# Patient Record
Sex: Male | Born: 1935 | Race: White | Hispanic: No | Marital: Married | Smoking: Never smoker
Health system: Southern US, Community
[De-identification: ages and names within clinical notes are randomized; demographics above are authoritative.]

## PROBLEM LIST (undated history)

## (undated) DIAGNOSIS — I10 Essential (primary) hypertension: Secondary | ICD-10-CM

## (undated) DIAGNOSIS — I639 Cerebral infarction, unspecified: Secondary | ICD-10-CM

## (undated) DIAGNOSIS — K219 Gastro-esophageal reflux disease without esophagitis: Secondary | ICD-10-CM

## (undated) DIAGNOSIS — Z8711 Personal history of peptic ulcer disease: Secondary | ICD-10-CM

## (undated) DIAGNOSIS — I499 Cardiac arrhythmia, unspecified: Secondary | ICD-10-CM

## (undated) DIAGNOSIS — N429 Disorder of prostate, unspecified: Secondary | ICD-10-CM

## (undated) DIAGNOSIS — Z8719 Personal history of other diseases of the digestive system: Secondary | ICD-10-CM

## (undated) DIAGNOSIS — L719 Rosacea, unspecified: Secondary | ICD-10-CM

## (undated) DIAGNOSIS — F419 Anxiety disorder, unspecified: Secondary | ICD-10-CM

## (undated) DIAGNOSIS — I219 Acute myocardial infarction, unspecified: Secondary | ICD-10-CM

## (undated) DIAGNOSIS — G629 Polyneuropathy, unspecified: Secondary | ICD-10-CM

## (undated) DIAGNOSIS — K759 Inflammatory liver disease, unspecified: Secondary | ICD-10-CM

## (undated) DIAGNOSIS — N189 Chronic kidney disease, unspecified: Secondary | ICD-10-CM

## (undated) HISTORY — PX: EYE SURGERY: SHX253

## (undated) HISTORY — PX: CORONARY ARTERY BYPASS GRAFT: SHX141

---

## 2000-12-13 DIAGNOSIS — Z951 Presence of aortocoronary bypass graft: Secondary | ICD-10-CM | POA: Insufficient documentation

## 2004-06-30 ENCOUNTER — Ambulatory Visit: Payer: Self-pay | Admitting: Urology

## 2004-11-26 ENCOUNTER — Ambulatory Visit: Payer: Self-pay | Admitting: Urology

## 2004-12-24 ENCOUNTER — Ambulatory Visit: Payer: Self-pay | Admitting: Urology

## 2005-01-06 ENCOUNTER — Ambulatory Visit: Payer: Self-pay | Admitting: Internal Medicine

## 2005-10-10 ENCOUNTER — Inpatient Hospital Stay: Payer: Self-pay | Admitting: Internal Medicine

## 2005-11-08 ENCOUNTER — Ambulatory Visit: Payer: Self-pay | Admitting: Gastroenterology

## 2005-12-17 IMAGING — RF DG UGI W/O KUB
1 series · 15 of 24 positions shown · non-contrast
Comparison: none

REASON FOR EXAM: duodenum diverticulum
COMMENTS:

[Series 1: run · 15 of 26 slices shown]
[im 1/26]
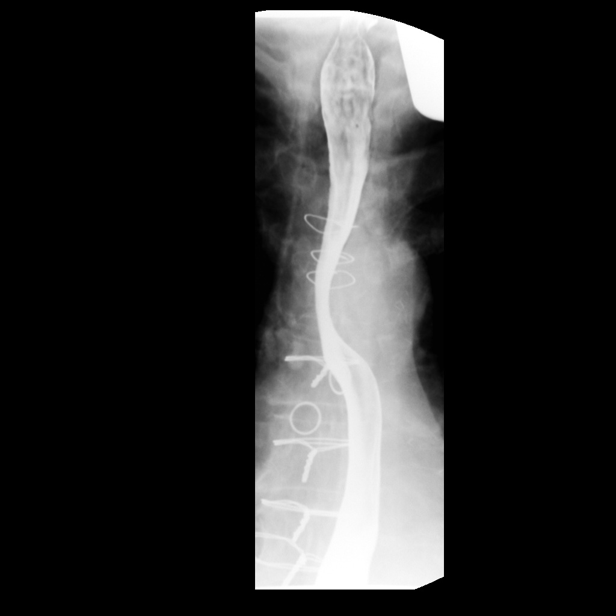
[im 3/26]
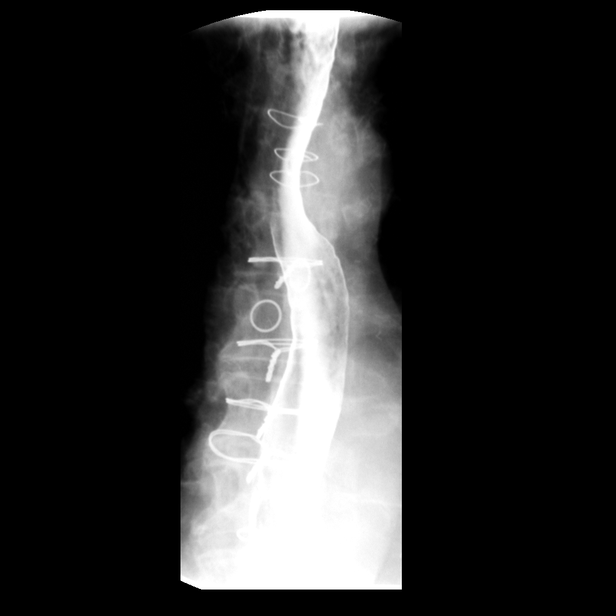
[im 5/26]
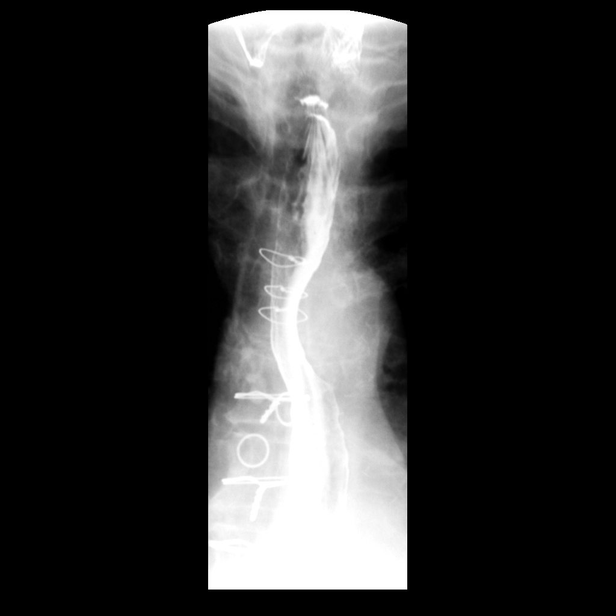
[im 6/26]
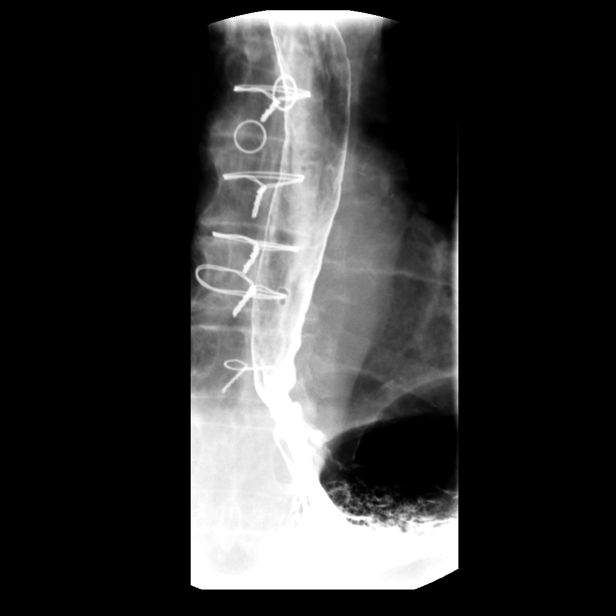
[im 8/26]
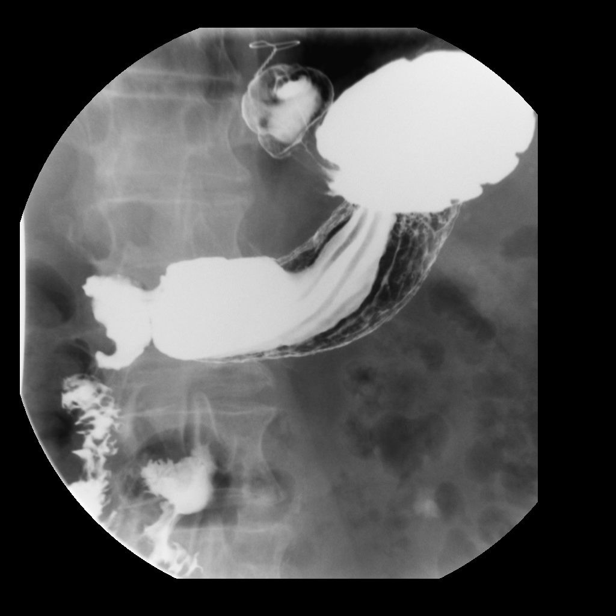
[im 9/26]
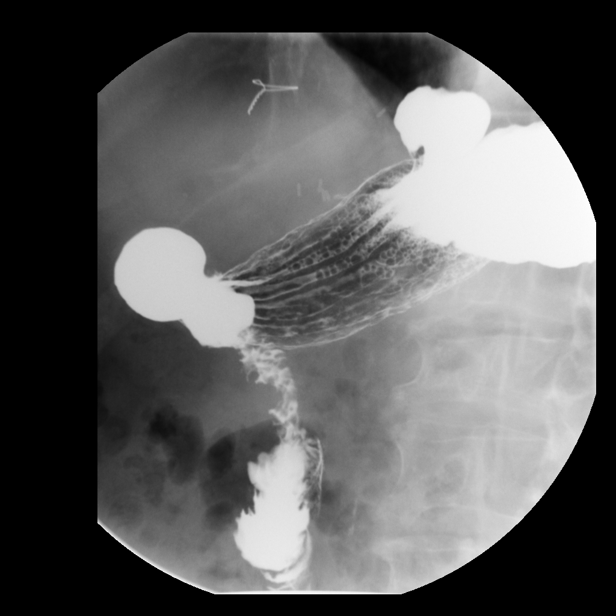
[im 11/26]
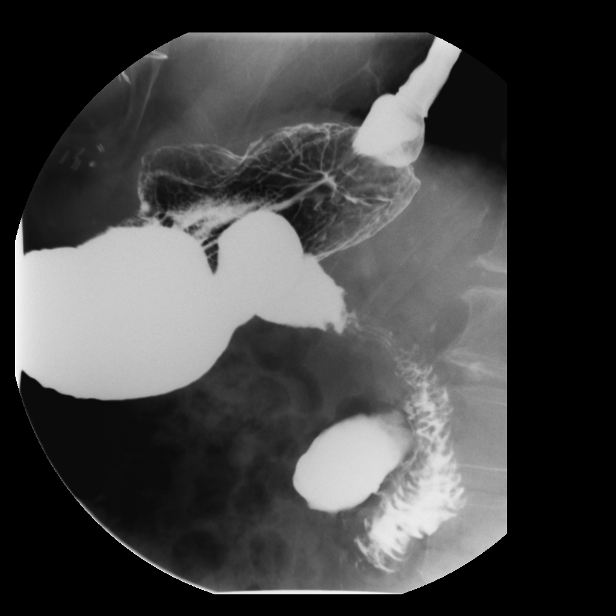
[im 14/26]
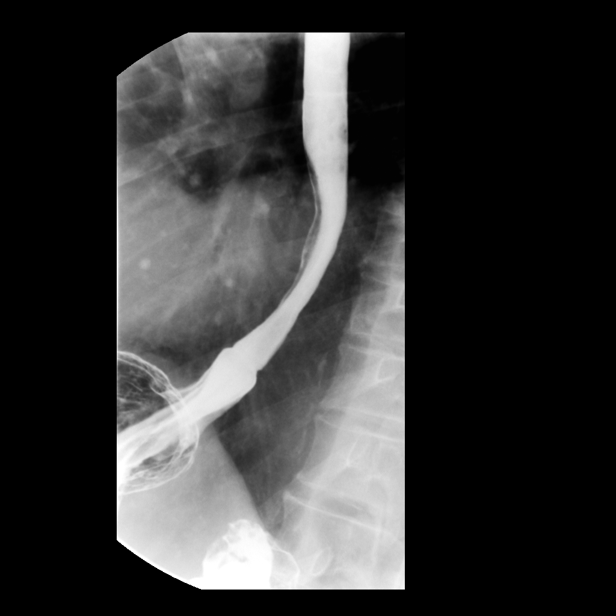
[im 15/26]
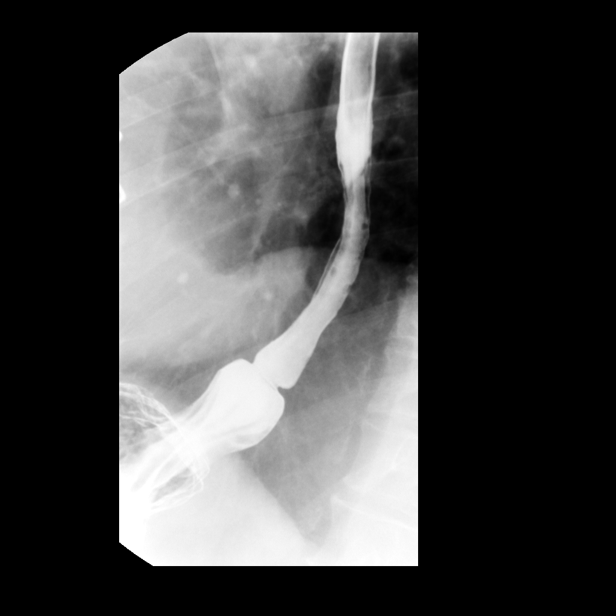
[im 17/26]
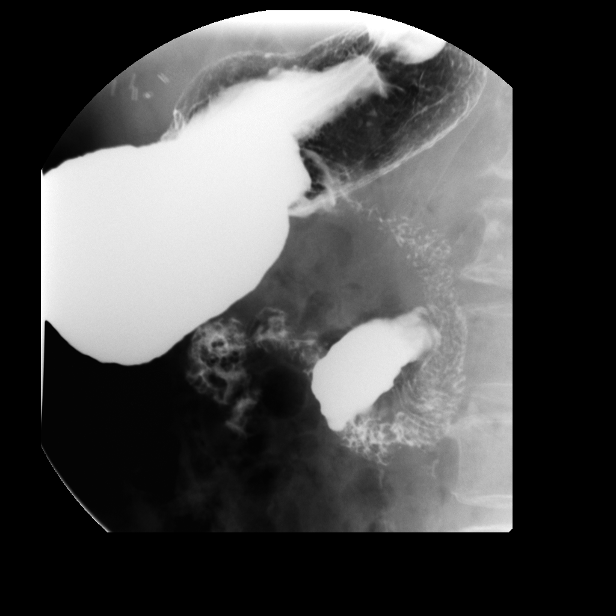
[im 18/26]
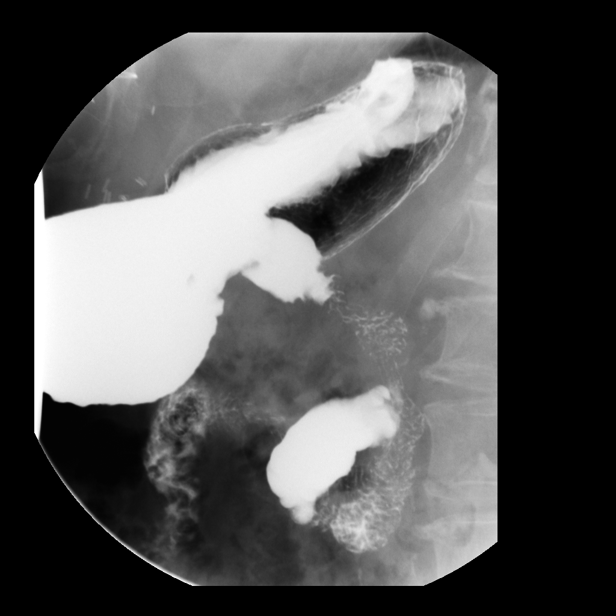
[im 20/26]
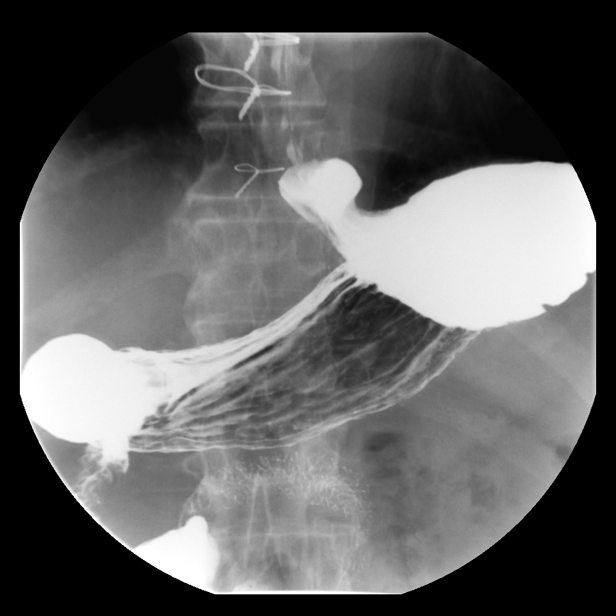
[im 22/26]
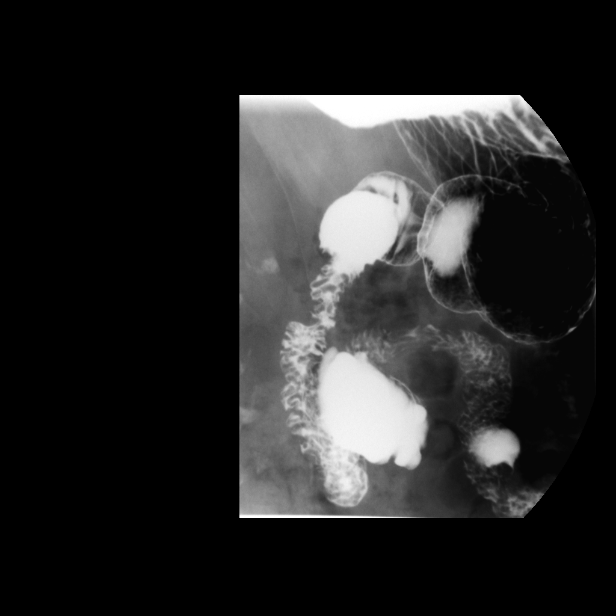
[im 23/26]
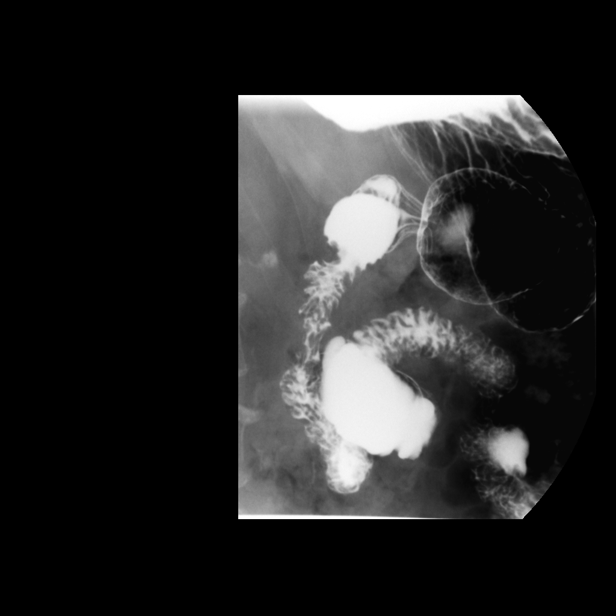
[im 26/26]
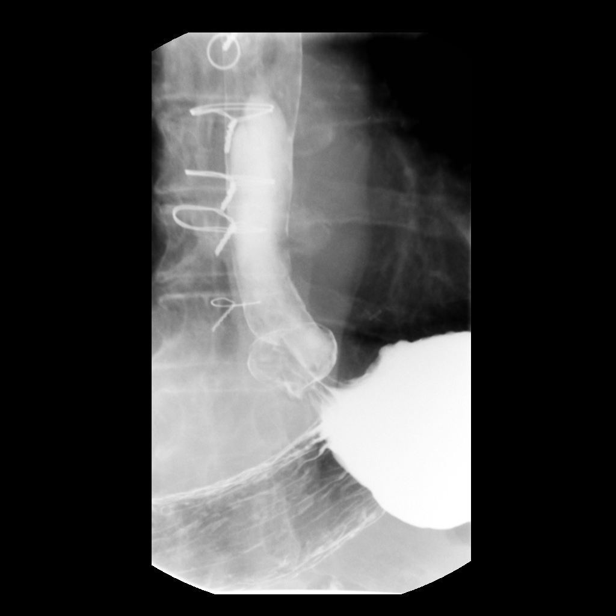

[15 of 24 positions shown; findings below may reference images not displayed]

PROCEDURE:     FL  - FL UPPER GI  - December 24, 2004  [DATE]

RESULT:     Barium passed through the esophagus unobstructed. A direct
sliding type hiatal hernia is present.  A moderate amount of
gastroesophageal reflux is noted on the water siphon exam.

A 12.5 mm barium impregnated tablet passed through the esophagus into the
stomach unobstructed.

The stomach appears normal throughout its length and empties without delay.
No ulcer craters are seen.  The duodenal bulb is within normal limits.

In the distal descending and proximal transverse duodenum there is a large
diverticulum present.  There is a second smaller diverticulum noted beyond
the ligament of Treitz.
IMPRESSION: 1)Direct sliding type hiatal hernia with a mild amount of gastroesophageal
reflux.

2)No ulcer crater is seen.

3)Large diverticulum in the descending duodenum and smaller one just beyond
the ligament of Treitz.

## 2006-03-27 ENCOUNTER — Ambulatory Visit: Payer: Self-pay | Admitting: Urology

## 2006-07-29 ENCOUNTER — Other Ambulatory Visit: Payer: Self-pay

## 2006-07-29 ENCOUNTER — Inpatient Hospital Stay: Payer: Self-pay | Admitting: Internal Medicine

## 2006-08-08 ENCOUNTER — Ambulatory Visit: Payer: Self-pay | Admitting: Unknown Physician Specialty

## 2007-05-26 ENCOUNTER — Other Ambulatory Visit: Payer: Self-pay

## 2007-05-26 ENCOUNTER — Emergency Department: Payer: Self-pay | Admitting: Unknown Physician Specialty

## 2007-05-27 ENCOUNTER — Other Ambulatory Visit: Payer: Self-pay

## 2007-05-27 ENCOUNTER — Emergency Department: Payer: Self-pay | Admitting: Emergency Medicine

## 2008-03-31 ENCOUNTER — Ambulatory Visit: Payer: Self-pay | Admitting: Urology

## 2008-06-12 ENCOUNTER — Ambulatory Visit: Payer: Self-pay | Admitting: Cardiology

## 2009-07-23 ENCOUNTER — Ambulatory Visit: Payer: Self-pay | Admitting: Ophthalmology

## 2009-07-27 ENCOUNTER — Ambulatory Visit: Payer: Self-pay | Admitting: Ophthalmology

## 2011-04-04 ENCOUNTER — Ambulatory Visit: Payer: Self-pay | Admitting: Urology

## 2011-04-26 ENCOUNTER — Ambulatory Visit: Payer: Self-pay | Admitting: Internal Medicine

## 2012-04-02 DIAGNOSIS — N401 Enlarged prostate with lower urinary tract symptoms: Secondary | ICD-10-CM | POA: Insufficient documentation

## 2012-04-02 DIAGNOSIS — N200 Calculus of kidney: Secondary | ICD-10-CM | POA: Insufficient documentation

## 2012-04-18 IMAGING — US US CAROTID DUPLEX BILAT
1 series · 17 of 24 positions shown · non-contrast
Comparison: none

REASON FOR EXAM: dizziness ataxia hx CVA
COMMENTS:

PROCEDURE:     LORRAINE - LORRAINE CAROTID DOPPLER BILATERAL  - April 26, 2011  [DATE]
RESULT:
Procedure: Grayscale, Duplex Doppler and SPECTRAL waveform and color flow
images were performed of the right and left carotid systems.

[Series 1: us carotid duplex bilat · 17 of 63 slices shown]
[im 1/63]
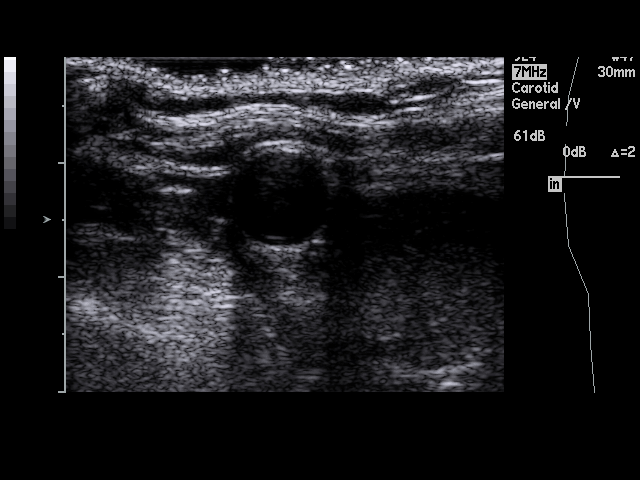
[im 6/63]
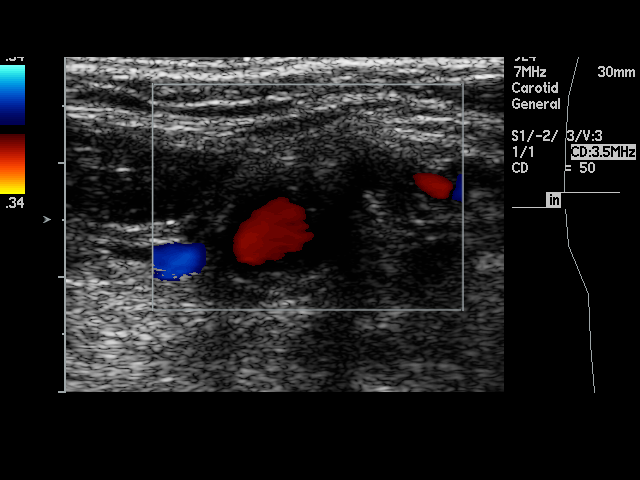
[im 9/63]
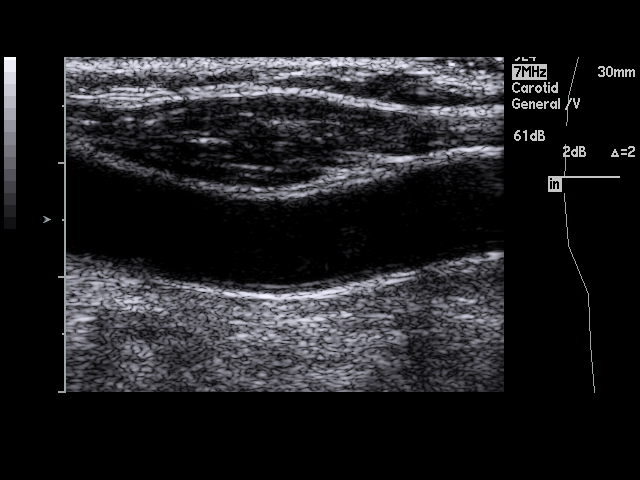
[im 11/63]
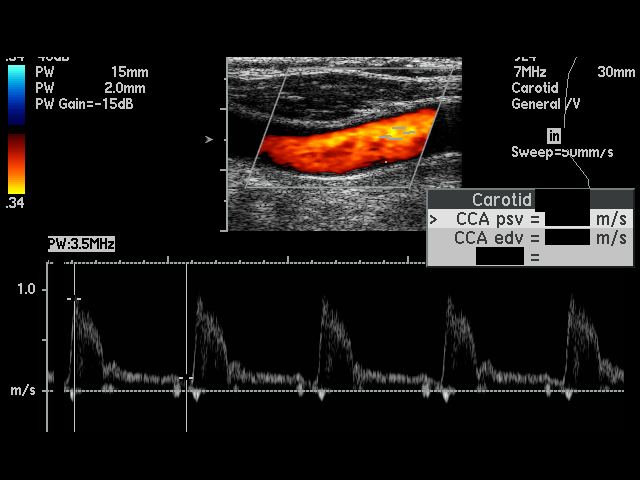
[im 17/63]
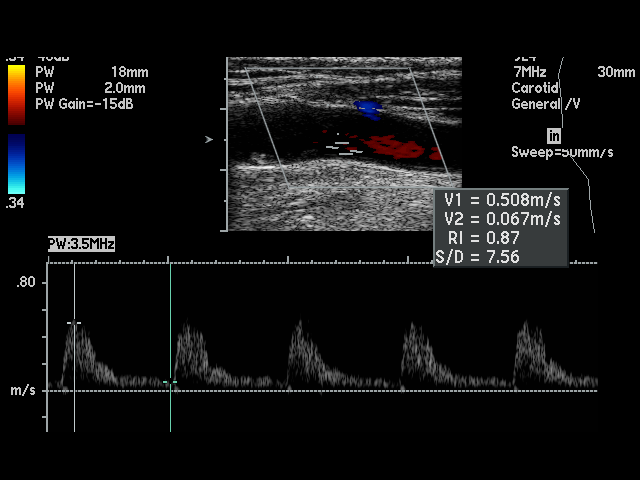
[im 19/63]
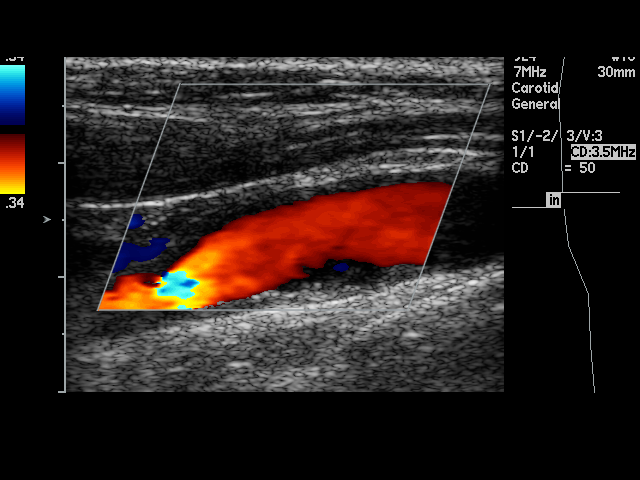
[im 25/63]
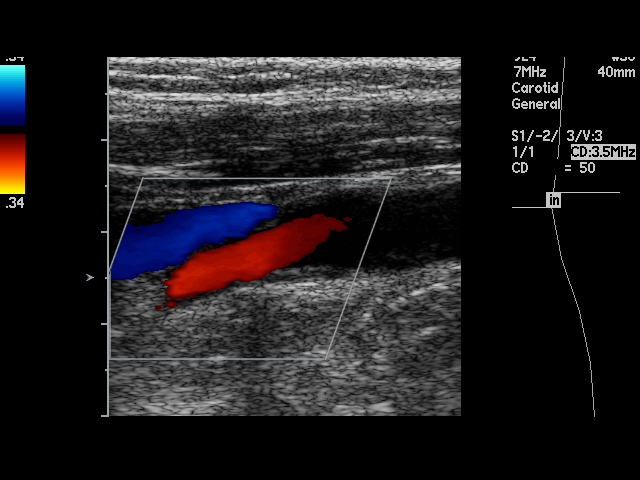
[im 27/63]
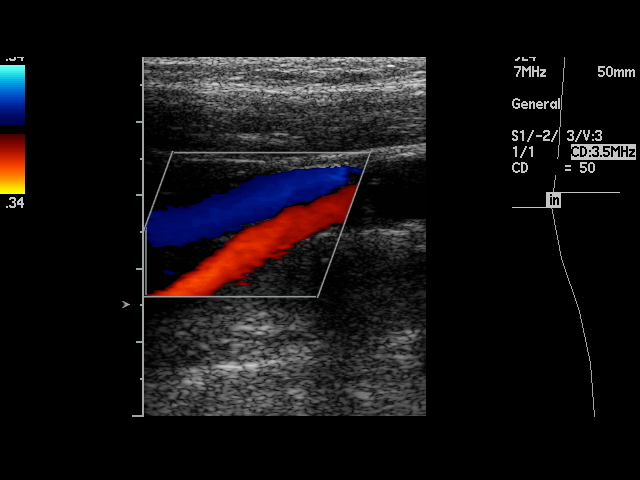
[im 33/63]
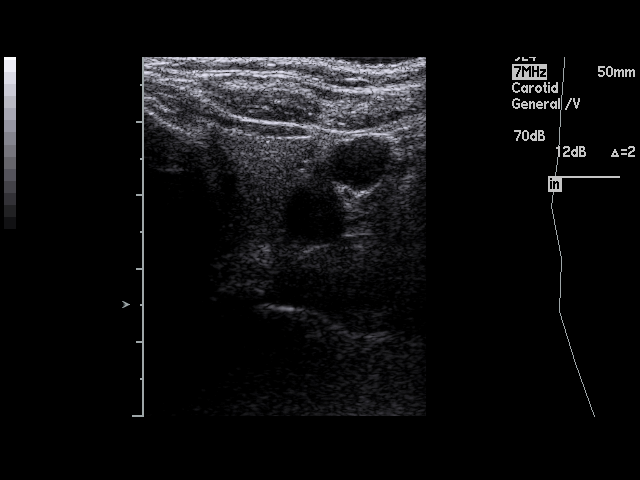
[im 36/63]
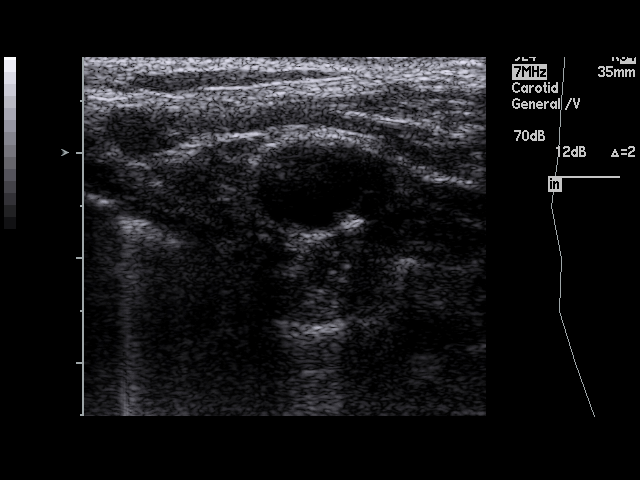
[im 38/63]
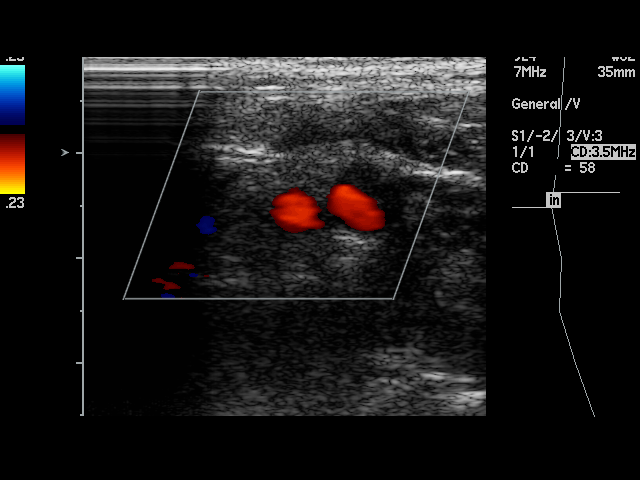
[im 44/63]
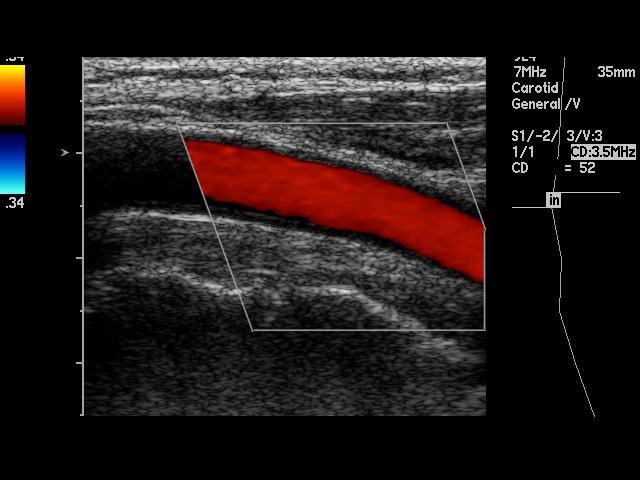
[im 46/63]
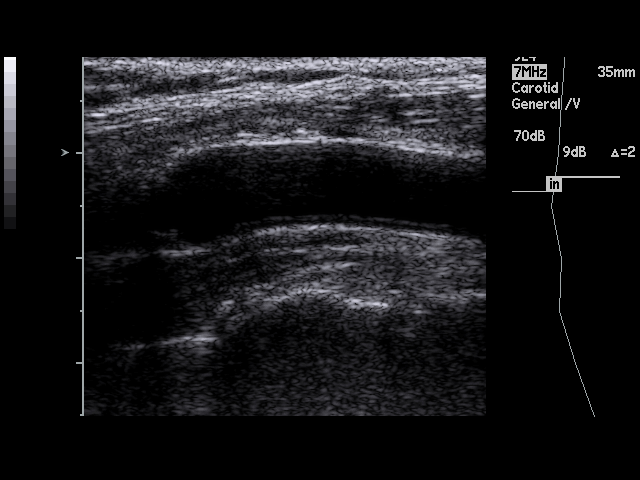
[im 52/63]
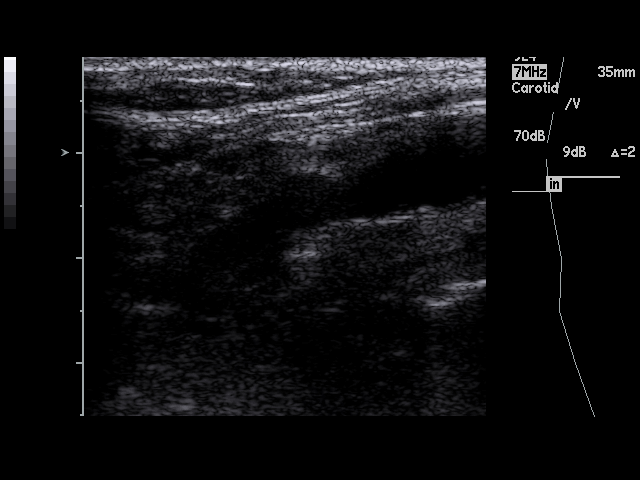
[im 54/63]
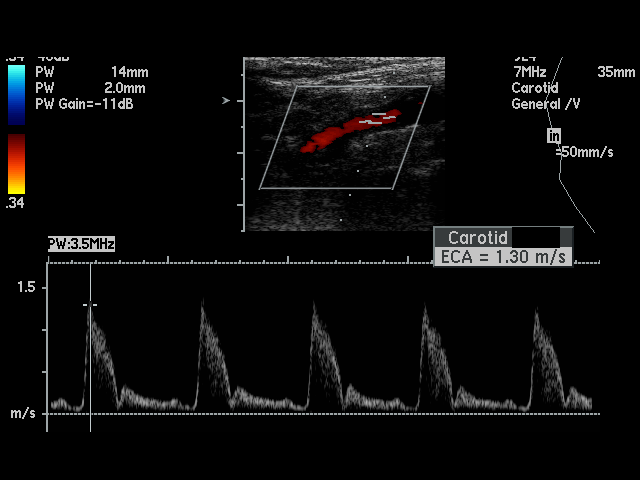
[im 57/63]
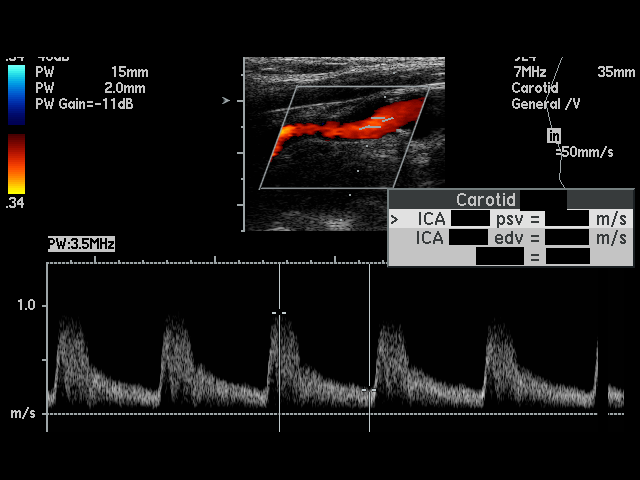
[im 63/63]
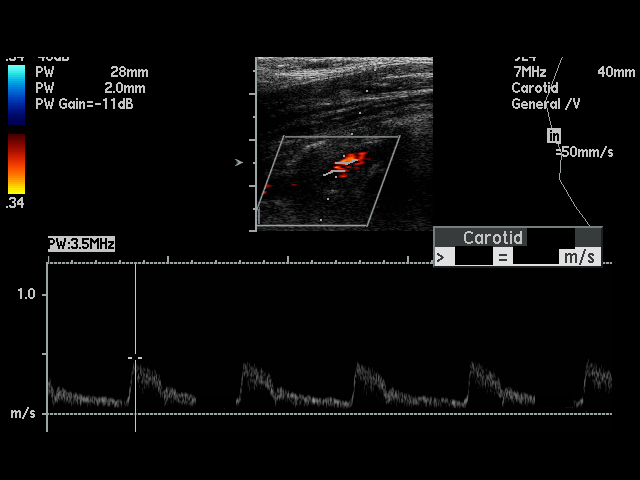

[17 of 24 positions shown; findings below may reference images not displayed]

FINDINGS: The right carotid system demonstrates mixed, smooth and calcified
plaque within the internal carotid artery and carotid bulb demonstrating
less than 50% stenosis. Similar findings are identified within the left
carotid system.

Color flow and SPECTRAL waveform imaging is unremarkable within the right
and left carotid systems. ICA/CCA ratios:

Right:
Left:

Antegrade flow is identified within the right and left vertebral arteries.
IMPRESSION: No sonographic evidence of hemodynamically significant
stenosis within the right or left carotid systems.

## 2013-01-16 ENCOUNTER — Ambulatory Visit: Payer: Self-pay | Admitting: Ophthalmology

## 2013-01-16 DIAGNOSIS — I10 Essential (primary) hypertension: Secondary | ICD-10-CM

## 2013-01-23 ENCOUNTER — Ambulatory Visit: Payer: Self-pay | Admitting: Ophthalmology

## 2013-04-29 ENCOUNTER — Ambulatory Visit: Payer: Self-pay | Admitting: Urology

## 2013-10-07 DIAGNOSIS — I10 Essential (primary) hypertension: Secondary | ICD-10-CM | POA: Insufficient documentation

## 2013-10-07 DIAGNOSIS — E785 Hyperlipidemia, unspecified: Secondary | ICD-10-CM | POA: Insufficient documentation

## 2013-10-07 DIAGNOSIS — G459 Transient cerebral ischemic attack, unspecified: Secondary | ICD-10-CM | POA: Insufficient documentation

## 2013-10-07 DIAGNOSIS — K219 Gastro-esophageal reflux disease without esophagitis: Secondary | ICD-10-CM | POA: Insufficient documentation

## 2013-12-09 DIAGNOSIS — R0602 Shortness of breath: Secondary | ICD-10-CM | POA: Insufficient documentation

## 2013-12-17 DIAGNOSIS — M5430 Sciatica, unspecified side: Secondary | ICD-10-CM | POA: Insufficient documentation

## 2014-01-28 ENCOUNTER — Ambulatory Visit: Payer: Self-pay | Admitting: Internal Medicine

## 2014-02-14 ENCOUNTER — Ambulatory Visit: Payer: Self-pay | Admitting: Internal Medicine

## 2014-02-20 ENCOUNTER — Ambulatory Visit: Payer: Self-pay | Admitting: Internal Medicine

## 2014-05-28 ENCOUNTER — Ambulatory Visit: Payer: Self-pay | Admitting: Internal Medicine

## 2014-06-17 ENCOUNTER — Ambulatory Visit: Payer: Self-pay | Admitting: Internal Medicine

## 2014-10-24 NOTE — Op Note (Signed)
PATIENT NAME:  Terry CandyHORNER, [name] MR#:  [id number] DATE OF BIRTH:  Dec 23, 1923  DATE OF PROCEDURE:  01/23/2013  PROCEDURES PERFORMED: 1.  Pars plana vitrectomy of the left eye.  2.  Internal limiting membrane peel of the left eye.  3.  Focal retinal laser of the left eye.   PREOPERATIVE DIAGNOSIS: Macular hole.   POSTOPERATIVE DIAGNOSES: 1.  Macular hole.  2.  Retinal tear.   PRIMARY SURGEON: Aron BabaMatthew Rohil Lesch, M.D.   ANESTHESIA: Retrobulbar block of the left eye, with monitored anesthesia care.   COMPLICATIONS: None.   INDICATIONS FOR PROCEDURE: The patient presented to my office with a sudden loss of vision in the left eye. Examination revealed a full-thickness macular hole of the left eye. Risks, benefits, and alternatives of the above procedure were discussed, and the patient wished to proceed.   DETAILS: After informed consent was obtained the patient was brought into the operative suite at [facility]. The patient was placed in supine position, was given a small dose of propofol, and a retrobulbar block was performed on the left eye by the primary surgeon without any complications. The left eye was prepped and draped in sterile manner. After a lid speculum was inserted, a 25-gauge trocar was placed inferotemporally through displaced conjunctiva in an oblique fashion 3 mm beyond the limbus. The infusion cannula was turned on and inserted through the trocar and secured in position with Steri-Strips. Two more trocars were placed in a similar fashion superotemporally and superonasally. The vitreous cutter and light-pipe were introduced in the eye and a core vitrectomy was performed. Vitreous face was engaged using suction and lifted off of the retina without complication. This vitreous face was then removed and the vitreous was trimmed for 360 degrees. Indocyanine green was injected onto the posterior pole and removed within 30 seconds. An internal limiting membrane peel was  performed for 360 degrees around the fovea for a total diameter of approximately 2 disk-diameters. A scleral depressed exam was performed for 360 degrees, and a small retinal tear was identified at approximately 10:30. Endolaser was introduced and 4 rows of laser were placed around the tear out to the ora serrata. An air-fluid exchange was performed; 4 minutes was allowed to elapse for dehydration. This remnant fluid was removed; 20% SF6 was used as an Systems developerair-gas exchange. All the trocars were removed and the wounds were noted to be airtight. Pressure in the eye was confirmed to be approximately 15 mmHg; 5 mg of dexamethasone was given into the inferior fornix and the lid speculum was removed. The eye was cleaned and TobraDex was placed in the eye. A patch and shield were placed over the eye and the patient was taken to postanesthesia care with instructions to remain face-down.     ____________________________ Ignacia FellingMatthew F. Champ MungoAppenzeller, MD mfa:dm D: 01/23/2013 08:07:20 ET T: 01/23/2013 09:28:25 ET JOB#: [id number]  cc: Ignacia FellingMatthew F. Champ MungoAppenzeller, MD, <Dictator> Cline CoolsMATTHEW F Twain Stenseth MD ELECTRONICALLY SIGNED 02/20/2013 8:48

## 2014-12-04 ENCOUNTER — Encounter
Admission: RE | Admit: 2014-12-04 | Discharge: 2014-12-04 | Disposition: A | Discharge status: Home / Self Care | Payer: Medicare Other | Source: Ambulatory Visit | Attending: Ophthalmology | Admitting: Ophthalmology

## 2014-12-04 DIAGNOSIS — H2511 Age-related nuclear cataract, right eye: Secondary | ICD-10-CM | POA: Diagnosis not present

## 2014-12-04 DIAGNOSIS — I10 Essential (primary) hypertension: Secondary | ICD-10-CM | POA: Insufficient documentation

## 2014-12-04 DIAGNOSIS — Z01812 Encounter for preprocedural laboratory examination: Secondary | ICD-10-CM | POA: Insufficient documentation

## 2014-12-10 ENCOUNTER — Encounter: Payer: Self-pay | Admitting: *Deleted

## 2014-12-10 DIAGNOSIS — Z8711 Personal history of peptic ulcer disease: Secondary | ICD-10-CM | POA: Diagnosis not present

## 2014-12-10 DIAGNOSIS — E780 Pure hypercholesterolemia: Secondary | ICD-10-CM | POA: Diagnosis not present

## 2014-12-10 DIAGNOSIS — I252 Old myocardial infarction: Secondary | ICD-10-CM | POA: Diagnosis not present

## 2014-12-10 DIAGNOSIS — K219 Gastro-esophageal reflux disease without esophagitis: Secondary | ICD-10-CM | POA: Diagnosis not present

## 2014-12-10 DIAGNOSIS — I2510 Atherosclerotic heart disease of native coronary artery without angina pectoris: Secondary | ICD-10-CM | POA: Diagnosis not present

## 2014-12-10 DIAGNOSIS — K5790 Diverticulosis of intestine, part unspecified, without perforation or abscess without bleeding: Secondary | ICD-10-CM | POA: Diagnosis not present

## 2014-12-10 DIAGNOSIS — Z87898 Personal history of other specified conditions: Secondary | ICD-10-CM | POA: Diagnosis not present

## 2014-12-10 DIAGNOSIS — Z885 Allergy status to narcotic agent status: Secondary | ICD-10-CM | POA: Diagnosis not present

## 2014-12-10 DIAGNOSIS — J40 Bronchitis, not specified as acute or chronic: Secondary | ICD-10-CM | POA: Diagnosis not present

## 2014-12-10 DIAGNOSIS — I10 Essential (primary) hypertension: Secondary | ICD-10-CM | POA: Diagnosis not present

## 2014-12-10 DIAGNOSIS — I499 Cardiac arrhythmia, unspecified: Secondary | ICD-10-CM | POA: Diagnosis not present

## 2014-12-10 DIAGNOSIS — Z951 Presence of aortocoronary bypass graft: Secondary | ICD-10-CM | POA: Diagnosis not present

## 2014-12-10 DIAGNOSIS — Z9842 Cataract extraction status, left eye: Secondary | ICD-10-CM | POA: Diagnosis not present

## 2014-12-10 DIAGNOSIS — F419 Anxiety disorder, unspecified: Secondary | ICD-10-CM | POA: Diagnosis not present

## 2014-12-10 DIAGNOSIS — H2511 Age-related nuclear cataract, right eye: Secondary | ICD-10-CM | POA: Diagnosis not present

## 2014-12-10 DIAGNOSIS — L719 Rosacea, unspecified: Secondary | ICD-10-CM | POA: Diagnosis not present

## 2014-12-10 DIAGNOSIS — Z8673 Personal history of transient ischemic attack (TIA), and cerebral infarction without residual deficits: Secondary | ICD-10-CM | POA: Diagnosis not present

## 2014-12-10 DIAGNOSIS — K449 Diaphragmatic hernia without obstruction or gangrene: Secondary | ICD-10-CM | POA: Diagnosis not present

## 2014-12-15 ENCOUNTER — Ambulatory Visit: Payer: Medicare Other | Admitting: *Deleted

## 2014-12-15 ENCOUNTER — Encounter: Payer: Self-pay | Admitting: *Deleted

## 2014-12-15 ENCOUNTER — Ambulatory Visit
Admission: RE | Admit: 2014-12-15 | Discharge: 2014-12-15 | Disposition: A | Discharge status: Home / Self Care | Payer: Medicare Other | Source: Ambulatory Visit | Attending: Ophthalmology | Admitting: Ophthalmology

## 2014-12-15 ENCOUNTER — Encounter
Admission: RE | Disposition: A | Discharge status: Home / Self Care | Payer: Self-pay | Source: Ambulatory Visit | Attending: Ophthalmology

## 2014-12-15 DIAGNOSIS — K5790 Diverticulosis of intestine, part unspecified, without perforation or abscess without bleeding: Secondary | ICD-10-CM | POA: Insufficient documentation

## 2014-12-15 DIAGNOSIS — F419 Anxiety disorder, unspecified: Secondary | ICD-10-CM | POA: Insufficient documentation

## 2014-12-15 DIAGNOSIS — Z951 Presence of aortocoronary bypass graft: Secondary | ICD-10-CM | POA: Insufficient documentation

## 2014-12-15 DIAGNOSIS — I499 Cardiac arrhythmia, unspecified: Secondary | ICD-10-CM | POA: Insufficient documentation

## 2014-12-15 DIAGNOSIS — H2511 Age-related nuclear cataract, right eye: Secondary | ICD-10-CM | POA: Diagnosis not present

## 2014-12-15 DIAGNOSIS — K219 Gastro-esophageal reflux disease without esophagitis: Secondary | ICD-10-CM | POA: Insufficient documentation

## 2014-12-15 DIAGNOSIS — Z885 Allergy status to narcotic agent status: Secondary | ICD-10-CM | POA: Insufficient documentation

## 2014-12-15 DIAGNOSIS — I252 Old myocardial infarction: Secondary | ICD-10-CM | POA: Insufficient documentation

## 2014-12-15 DIAGNOSIS — I2510 Atherosclerotic heart disease of native coronary artery without angina pectoris: Secondary | ICD-10-CM | POA: Insufficient documentation

## 2014-12-15 DIAGNOSIS — Z87898 Personal history of other specified conditions: Secondary | ICD-10-CM | POA: Insufficient documentation

## 2014-12-15 DIAGNOSIS — L719 Rosacea, unspecified: Secondary | ICD-10-CM | POA: Insufficient documentation

## 2014-12-15 DIAGNOSIS — E780 Pure hypercholesterolemia: Secondary | ICD-10-CM | POA: Insufficient documentation

## 2014-12-15 DIAGNOSIS — Z8673 Personal history of transient ischemic attack (TIA), and cerebral infarction without residual deficits: Secondary | ICD-10-CM | POA: Insufficient documentation

## 2014-12-15 DIAGNOSIS — K449 Diaphragmatic hernia without obstruction or gangrene: Secondary | ICD-10-CM | POA: Insufficient documentation

## 2014-12-15 DIAGNOSIS — Z8711 Personal history of peptic ulcer disease: Secondary | ICD-10-CM | POA: Insufficient documentation

## 2014-12-15 DIAGNOSIS — J40 Bronchitis, not specified as acute or chronic: Secondary | ICD-10-CM | POA: Insufficient documentation

## 2014-12-15 DIAGNOSIS — I10 Essential (primary) hypertension: Secondary | ICD-10-CM | POA: Insufficient documentation

## 2014-12-15 DIAGNOSIS — Z9842 Cataract extraction status, left eye: Secondary | ICD-10-CM | POA: Insufficient documentation

## 2014-12-15 HISTORY — DX: Inflammatory liver disease, unspecified: K75.9

## 2014-12-15 HISTORY — DX: Polyneuropathy, unspecified: G62.9

## 2014-12-15 HISTORY — DX: Cardiac arrhythmia, unspecified: I49.9

## 2014-12-15 HISTORY — DX: Disorder of prostate, unspecified: N42.9

## 2014-12-15 HISTORY — PX: CATARACT EXTRACTION EXTRACAPSULAR: SHX1305

## 2014-12-15 HISTORY — DX: Rosacea, unspecified: L71.9

## 2014-12-15 HISTORY — DX: Anxiety disorder, unspecified: F41.9

## 2014-12-15 HISTORY — DX: Personal history of peptic ulcer disease: Z87.11

## 2014-12-15 HISTORY — DX: Cerebral infarction, unspecified: I63.9

## 2014-12-15 HISTORY — DX: Personal history of other diseases of the digestive system: Z87.19

## 2014-12-15 HISTORY — DX: Gastro-esophageal reflux disease without esophagitis: K21.9

## 2014-12-15 HISTORY — DX: Acute myocardial infarction, unspecified: I21.9

## 2014-12-15 HISTORY — DX: Essential (primary) hypertension: I10

## 2014-12-15 HISTORY — DX: Chronic kidney disease, unspecified: N18.9

## 2014-12-15 SURGERY — EXTRACTION, CATARACT, WITH IOL INSERTION
Anesthesia: Monitor Anesthesia Care | Site: Eye | Laterality: Right | Wound class: Clean

## 2014-12-15 MED ORDER — PHENYLEPHRINE HCL 10 % OP SOLN
1.0000 [drp] | OPHTHALMIC | Status: AC | PRN
  Administered 2014-12-15 (×4): 1 [drp] via OPHTHALMIC

## 2014-12-15 MED ORDER — CEFUROXIME OPHTHALMIC INJECTION 1 MG/0.1 ML
INJECTION | OPHTHALMIC | Status: DC | PRN
  Administered 2014-12-15: 0.1 mL via INTRACAMERAL

## 2014-12-15 MED ORDER — EPINEPHRINE HCL 1 MG/ML IJ SOLN
INTRAMUSCULAR | Status: AC
  Filled 2014-12-15: qty 2

## 2014-12-15 MED ORDER — TETRACAINE HCL 0.5 % OP SOLN
OPHTHALMIC | Status: AC
  Filled 2014-12-15: qty 2

## 2014-12-15 MED ORDER — LIDOCAINE HCL (PF) 4 % IJ SOLN
INTRAMUSCULAR | Status: DC | PRN
  Administered 2014-12-15: 5 mL

## 2014-12-15 MED ORDER — MOXIFLOXACIN HCL 0.5 % OP SOLN - NO CHARGE
OPHTHALMIC | Status: DC | PRN
  Administered 2014-12-15: 1 [drp]

## 2014-12-15 MED ORDER — ALFENTANIL 500 MCG/ML IJ INJ
INJECTION | INTRAMUSCULAR | Status: DC | PRN
  Administered 2014-12-15: 500 ug via INTRAVENOUS

## 2014-12-15 MED ORDER — CARBACHOL 0.01 % IO SOLN
INTRAOCULAR | Status: DC | PRN
  Administered 2014-12-15: 0.5 mL via INTRAOCULAR

## 2014-12-15 MED ORDER — MOXIFLOXACIN HCL 0.5 % OP SOLN
OPHTHALMIC | Status: AC
  Administered 2014-12-15: 1 [drp] via OPHTHALMIC
  Filled 2014-12-15: qty 3

## 2014-12-15 MED ORDER — CEFUROXIME OPHTHALMIC INJECTION 1 MG/0.1 ML
INJECTION | OPHTHALMIC | Status: AC
  Filled 2014-12-15: qty 0.1

## 2014-12-15 MED ORDER — CYCLOPENTOLATE HCL 2 % OP SOLN
OPHTHALMIC | Status: AC
  Administered 2014-12-15: 1 [drp] via OPHTHALMIC
  Filled 2014-12-15: qty 2

## 2014-12-15 MED ORDER — LIDOCAINE HCL (PF) 1 % IJ SOLN
INTRAOCULAR | Status: DC | PRN
  Administered 2014-12-15: .5 mL

## 2014-12-15 MED ORDER — HYDRALAZINE HCL 20 MG/ML IJ SOLN
INTRAMUSCULAR | Status: DC | PRN
  Administered 2014-12-15: 7 mg via INTRAVENOUS

## 2014-12-15 MED ORDER — NA CHONDROIT SULF-NA HYALURON 40-17 MG/ML IO SOLN
INTRAOCULAR | Status: DC | PRN
  Administered 2014-12-15: 1 mL via INTRAOCULAR

## 2014-12-15 MED ORDER — PHENYLEPHRINE HCL 10 % OP SOLN
OPHTHALMIC | Status: AC
  Administered 2014-12-15: 1 [drp] via OPHTHALMIC
  Filled 2014-12-15: qty 5

## 2014-12-15 MED ORDER — EPINEPHRINE HCL 1 MG/ML IJ SOLN
INTRAMUSCULAR | Status: DC | PRN
  Administered 2014-12-15: 200 mL

## 2014-12-15 MED ORDER — CYCLOPENTOLATE HCL 2 % OP SOLN
1.0000 [drp] | OPHTHALMIC | Status: AC | PRN
  Administered 2014-12-15 (×4): 1 [drp] via OPHTHALMIC

## 2014-12-15 MED ORDER — BUPIVACAINE HCL (PF) 0.75 % IJ SOLN
INTRAMUSCULAR | Status: AC
  Filled 2014-12-15: qty 10

## 2014-12-15 MED ORDER — SODIUM CHLORIDE 0.9 % IV SOLN
INTRAVENOUS | Status: DC
  Administered 2014-12-15: 08:00:00 via INTRAVENOUS

## 2014-12-15 MED ORDER — LABETALOL HCL 5 MG/ML IV SOLN
INTRAVENOUS | Status: DC | PRN
  Administered 2014-12-15: 10 mg via INTRAVENOUS

## 2014-12-15 MED ORDER — HYALURONIDASE HUMAN 150 UNIT/ML IJ SOLN
INTRAMUSCULAR | Status: AC
  Filled 2014-12-15: qty 1

## 2014-12-15 MED ORDER — TETRACAINE HCL 0.5 % OP SOLN
OPHTHALMIC | Status: DC | PRN
  Administered 2014-12-15: 1 [drp]

## 2014-12-15 MED ORDER — MOXIFLOXACIN HCL 0.5 % OP SOLN
1.0000 [drp] | OPHTHALMIC | Status: AC | PRN
  Administered 2014-12-15 (×3): 1 [drp] via OPHTHALMIC

## 2014-12-15 MED ORDER — NA CHONDROIT SULF-NA HYALURON 40-17 MG/ML IO SOLN
INTRAOCULAR | Status: AC
  Filled 2014-12-15: qty 1

## 2014-12-15 MED ORDER — LIDOCAINE HCL (PF) 4 % IJ SOLN
INTRAMUSCULAR | Status: AC
  Filled 2014-12-15: qty 10

## 2014-12-15 SURGICAL SUPPLY — 27 items
ACTIVE FMS ×3 IMPLANT
CORD BIP STRL DISP 12FT (MISCELLANEOUS) ×3 IMPLANT
DRAPE XRAY CASSETTE 23X24 (DRAPES) ×3 IMPLANT
ERASER HMR WETFIELD 18G (MISCELLANEOUS) ×3 IMPLANT
GLOVE BIO SURGEON STRL SZ8 (GLOVE) ×3 IMPLANT
GLOVE SURG LX 6.5 MICRO (GLOVE) ×2
GLOVE SURG LX 8.0 MICRO (GLOVE) ×2
GLOVE SURG LX STRL 6.5 MICRO (GLOVE) ×1 IMPLANT
GLOVE SURG LX STRL 8.0 MICRO (GLOVE) ×1 IMPLANT
GOWN STRL REUS W/ TWL LRG LVL3 (GOWN DISPOSABLE) ×1 IMPLANT
GOWN STRL REUS W/ TWL XL LVL3 (GOWN DISPOSABLE) ×1 IMPLANT
GOWN STRL REUS W/TWL LRG LVL3 (GOWN DISPOSABLE) ×2
GOWN STRL REUS W/TWL XL LVL3 (GOWN DISPOSABLE) ×2
LENS IOL ACRSF IQ ULTRA 23.0 (Intraocular Lens) ×1 IMPLANT
LENS IOL ACRYSOF IQ 23.0 (Intraocular Lens) ×3 IMPLANT
PACK CATARACT (MISCELLANEOUS) ×3 IMPLANT
PACK CATARACT DINGLEDEIN LX (MISCELLANEOUS) ×3 IMPLANT
PACK EYE AFTER SURG (MISCELLANEOUS) ×3 IMPLANT
SHLD EYE VISITEC  UNIV (MISCELLANEOUS) ×3 IMPLANT
SOL PREP PVP 2OZ (MISCELLANEOUS) ×3
SOLUTION PREP PVP 2OZ (MISCELLANEOUS) ×1 IMPLANT
SUT SILK 5-0 (SUTURE) ×3 IMPLANT
SYR 5ML LL (SYRINGE) ×3 IMPLANT
SYR TB 1ML 27GX1/2 LL (SYRINGE) ×3 IMPLANT
WATER STERILE IRR 1000ML POUR (IV SOLUTION) ×3 IMPLANT
WIPE NON LINTING 3.25X3.25 (MISCELLANEOUS) ×3 IMPLANT
ultrasert lens ×3 IMPLANT

## 2014-12-15 NOTE — Anesthesia Preprocedure Evaluation (Signed)
Anesthesia Evaluation  Patient identified by MRN, date of birth, ID band Patient awake    Reviewed: Allergy & Precautions, NPO status , Patient's Chart, lab work & pertinent test results  Airway Mallampati: I  TM Distance: >3 FB Neck ROM: Limited    Dental  (+) Upper Dentures   Pulmonary    Pulmonary exam normal       Cardiovascular hypertension, Pt. on medications Normal cardiovascular exam    Neuro/Psych    GI/Hepatic GERD-  Medicated and Controlled,  Endo/Other    Renal/GU      Musculoskeletal   Abdominal Normal abdominal exam  (+)   Peds  Hematology   Anesthesia Other Findings   Reproductive/Obstetrics                             Anesthesia Physical Anesthesia Plan  ASA: III  Anesthesia Plan: MAC   Post-op Pain Management:    Induction:   Airway Management Planned: Nasal Cannula  Additional Equipment:   Intra-op Plan:   Post-operative Plan:   Informed Consent: I have reviewed the patients History and Physical, chart, labs and discussed the procedure including the risks, benefits and alternatives for the proposed anesthesia with the patient or authorized representative who has indicated his/her understanding and acceptance.     Plan Discussed with: CRNA  Anesthesia Plan Comments:         Anesthesia Quick Evaluation

## 2014-12-15 NOTE — Anesthesia Postprocedure Evaluation (Signed)
  Anesthesia Post-op Note  Patient: Terry Stewart  Procedure(s) Performed: Procedure(s) with comments: CATARACT EXTRACTION EXTRACAPSULAR WITH INTRAOCULAR LENS PLACEMENT (IOC) (Right) - [place]    1:18.8 AP%    24.8 CDE   35.04  Anesthesia type:MAC  Patient location: PACU  Post pain: Pain level controlled  Post assessment: Post-op Vital signs reviewed, Patient's Cardiovascular Status Stable, Respiratory Function Stable, Patent Airway and No signs of Nausea or vomiting  Post vital signs: Reviewed and stable  Last Vitals:  Filed Vitals:   12/15/14 0938  BP: 189/70  Pulse: 60  Temp:   Resp: 16    Level of consciousness: awake, alert  and patient cooperative  Complications: No apparent anesthesia complications

## 2014-12-15 NOTE — Discharge Instructions (Addendum)
See hand out.

## 2014-12-15 NOTE — Anesthesia Postprocedure Evaluation (Signed)
  Anesthesia Post-op Note  Patient: Terry Stewart  Procedure(s) Performed: Procedure(s) with comments: CATARACT EXTRACTION EXTRACAPSULAR WITH INTRAOCULAR LENS PLACEMENT (IOC) (Right) - [place]    1:18.8 AP%    24.8 CDE   35.04  Anesthesia type:MAC  Patient location: PACU  Post pain: Pain level controlled  Post assessment: Post-op Vital signs reviewed, Patient's Cardiovascular Status Stable, Respiratory Function Stable, Patent Airway and No signs of Nausea or vomiting  Post vital signs: Reviewed and stable  Last Vitals:  Filed Vitals:   12/15/14 0931  BP: 180/63  Pulse:   Temp: 36.1 C  Resp: 16    Level of consciousness: awake, alert  and patient cooperative  Complications: No apparent anesthesia complications

## 2014-12-15 NOTE — Op Note (Signed)
Date of Surgery: 12/15/2014 Date of Dictation: 12/15/2014 9:28 AM Pre-operative Diagnosis:  Nuclear Sclerotic Cataract right Eye Post-operative Diagnosis: same Procedure performed: Extra-capsular Cataract Extraction (ECCE) with placement of a posterior chamber intraocular lens (IOL) right Eye IOL:  Implant Name Type Inv. Item Serial No. Manufacturer Lot No. LRB No. Used  ultrasert lens     [id number]     Right 1   Anesthesia: 2% Lidocaine and 4% Marcaine in a 50/50 mixture with 10 unites/ml of Hylenex given as a peribulbar Anesthesiologist: Anesthesiologist: Linward Natal, MD CRNA: Darrol Jump, CRNA Complications: none Estimated Blood Loss: less than 1 ml  Description of procedure:  The patient was given anesthesia and sedation via intravenous access. The patient was then prepped and draped in the usual fashion. A 25-gauge needle was bent for initiating the capsulorhexis. A 5-0 silk suture was placed through the conjunctiva superior and inferiorly to serve as bridle sutures. Hemostasis was obtained at the superior limbus using an eraser cautery. A partial thickness groove was made at the anterior surgical limbus with a 64 Beaver blade and this was dissected anteriorly with an [employer]. The anterior chamber was entered at 10 o'clock with a 1.0 mm paracentesis knife and through the lamellar dissection with a 2.6 mm Alcon keratome. DiscoVisc was injected to replace the aqueous and a continuous tear curvilinear capsulorhexis was performed using a bent 25-gauge needle.  Balance salt on a syringe was used to perform hydro-dissection and phacoemulsification was carried out using a divide and conquer technique. Procedure(s) with comments: CATARACT EXTRACTION EXTRACAPSULAR WITH INTRAOCULAR LENS PLACEMENT (IOC) (Right) - [place]    1:18.8 AP%    24.8 CDE   35.04. Irrigation/aspiration was used to remove the residual cortex and the capsular bag was inflated with DiscoVisc. The intraocular lens was  inserted into the capsular bag using a pre-loaded UltraSert Delivery System. Irrigation/aspiration was used to remove the residual DiscoVisc. The wound was inflated with balanced salt and checked for leaks. None were found. Miostat was injected via the paracentesis track and 0.1 ml of cefuroxime containing 1 mg of drug  was injected via the paracentesis track. The wound was checked for leaks again and none were found.   The bridal sutures were removed and two drops of Vigamox were placed on the eye. An eye shield was placed to protect the eye and the patient was discharged to the recovery area in good condition.   Chaynce Schafer MD

## 2014-12-15 NOTE — Transfer of Care (Signed)
Immediate Anesthesia Transfer of Care Note  Patient: Terry Stewart  Procedure(s) Performed: Procedure(s) with comments: CATARACT EXTRACTION EXTRACAPSULAR WITH INTRAOCULAR LENS PLACEMENT (IOC) (Right) - [place]    1:18.8 AP%    24.8 CDE   35.04  Patient Location: PACU  Anesthesia Type:MAC  Level of Consciousness: awake, alert  and oriented  Airway & Oxygen Therapy: Patient Spontanous Breathing  Post-op Assessment: Report given to RN and Post -op Vital signs reviewed and stable  Post vital signs: Reviewed and stable  Last Vitals:  Filed Vitals:   12/15/14 0931  BP: 180/63  Pulse:   Temp: 36.1 C  Resp: 16    Complications: No apparent anesthesia complications

## 2014-12-15 NOTE — Interval H&P Note (Signed)
History and Physical Interval Note:  12/15/2014 8:51 AM  Terry Stewart  has presented today for surgery, with the diagnosis of cataract  The various methods of treatment have been discussed with the patient and family. After consideration of risks, benefits and other options for treatment, the patient has consented to  Procedure(s): CATARACT EXTRACTION EXTRACAPSULAR WITH INTRAOCULAR LENS PLACEMENT (IOC) (Right) as a surgical intervention .  The patient's history has been reviewed, patient examined, no change in status, stable for surgery.  I have reviewed the patient's chart and labs.  Questions were answered to the patient's satisfaction.     Matthias Bogus

## 2014-12-15 NOTE — H&P (Signed)
  History and physical was faxed and scanned in.   

## 2014-12-15 NOTE — Progress Notes (Signed)
IV discon 

## 2015-01-20 DIAGNOSIS — R05 Cough: Secondary | ICD-10-CM | POA: Insufficient documentation

## 2015-01-20 DIAGNOSIS — T464X5A Adverse effect of angiotensin-converting-enzyme inhibitors, initial encounter: Secondary | ICD-10-CM

## 2015-02-13 IMAGING — US ULTRASOUND RETROPERITONEAL COMPLETE
1 series · 13 of 25 positions shown · non-contrast
Comparison: 02/14/2014 and CT 07/29/2006

CLINICAL DATA: Abdominal aortic calcifications on radiographs.
Evaluate for aneurysm.

EXAM:
RETROPERITONEAL ULTRASOUND COMPLETE
TECHNIQUE: Ultrasound examination of the abdominal aorta was performed to
evaluate for abdominal aortic aneurysm. The common iliac arteries,
IVC, and kidneys were also evaluated.

[Series 1: ultrasound retroperitoneal complete · 0.28mm/px · 13 of 71 slices shown]
[im 1/71]
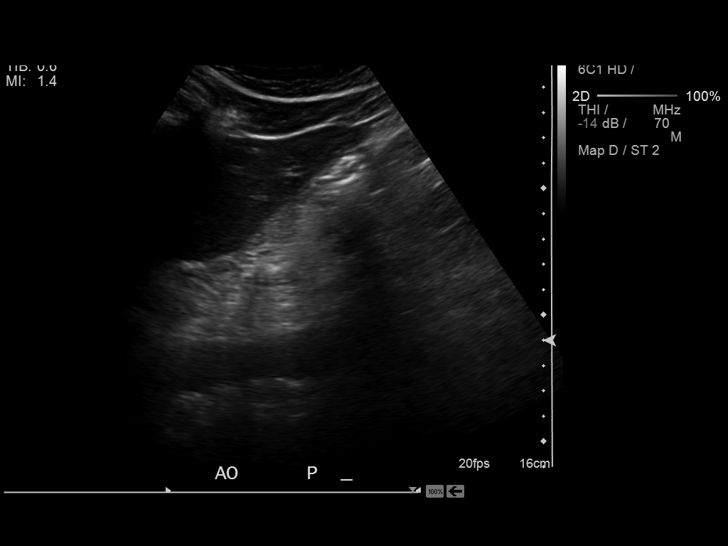
[im 6/71]
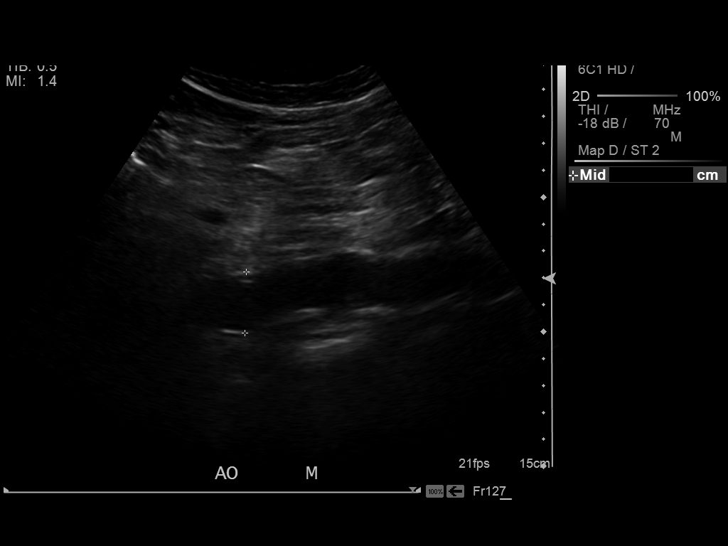
[im 12/71]
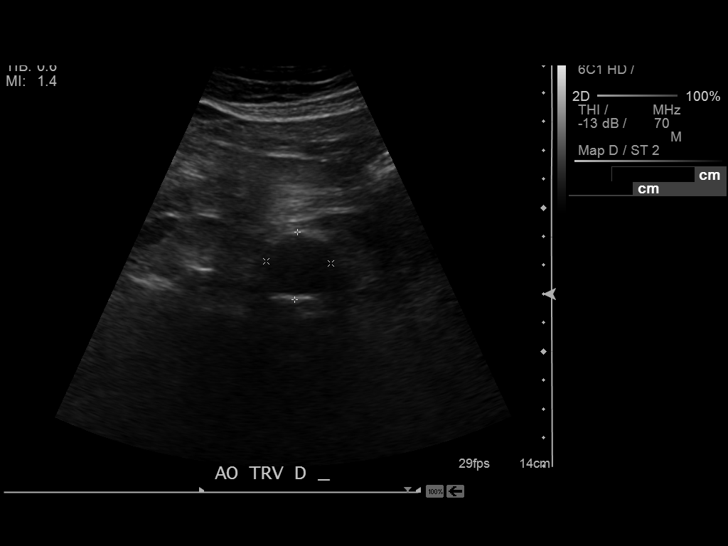
[im 18/71]
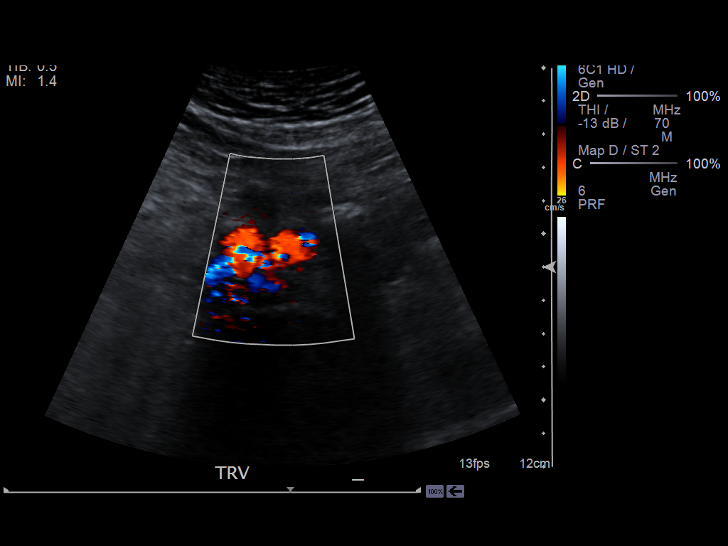
[im 24/71]
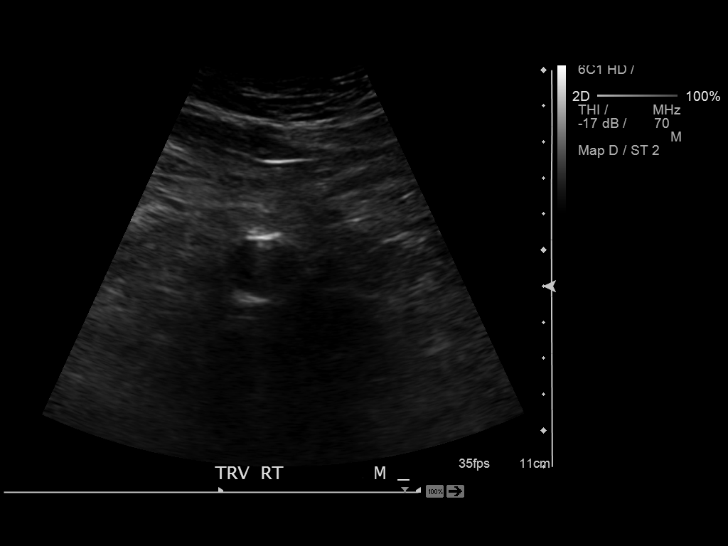
[im 30/71]
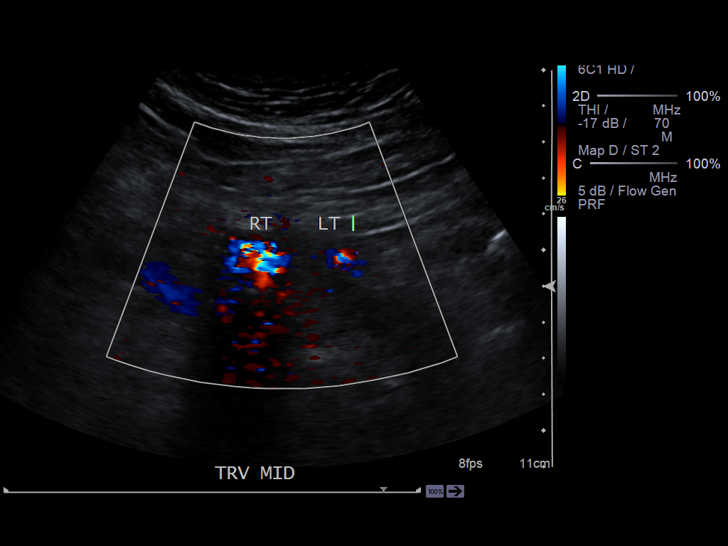
[im 36/71]
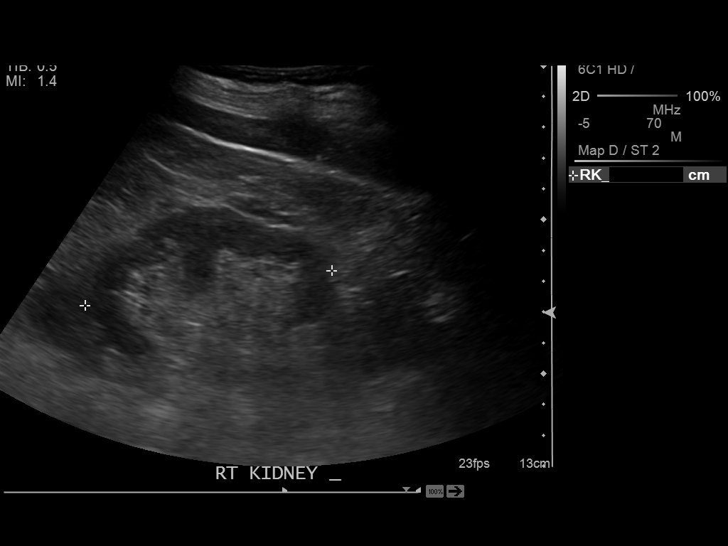
[im 41/71]
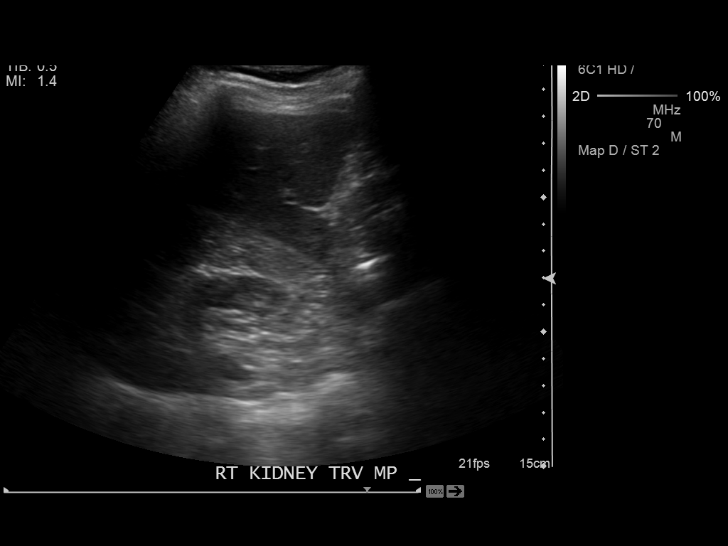
[im 47/71]
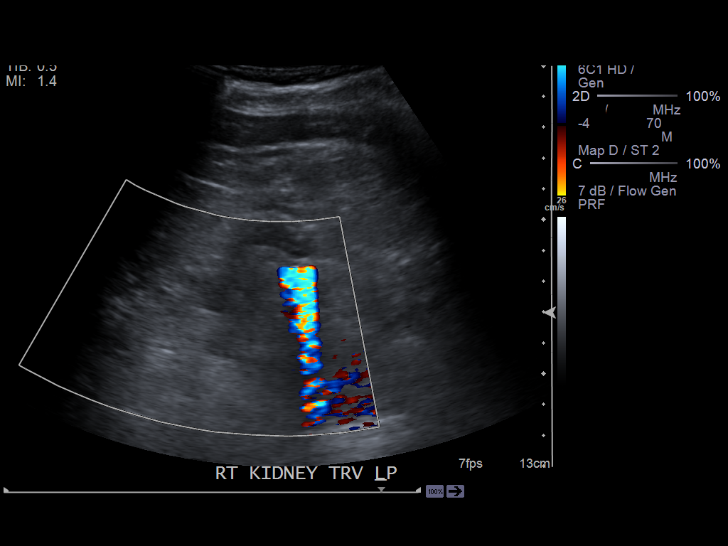
[im 53/71]
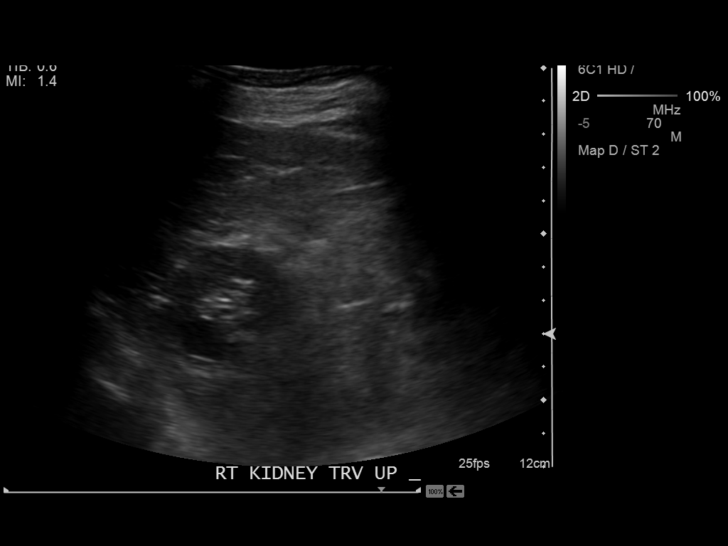
[im 59/71]
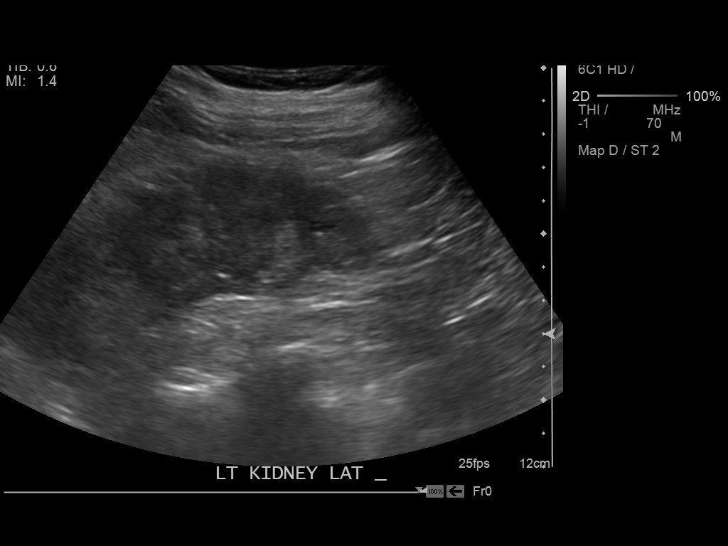
[im 65/71]
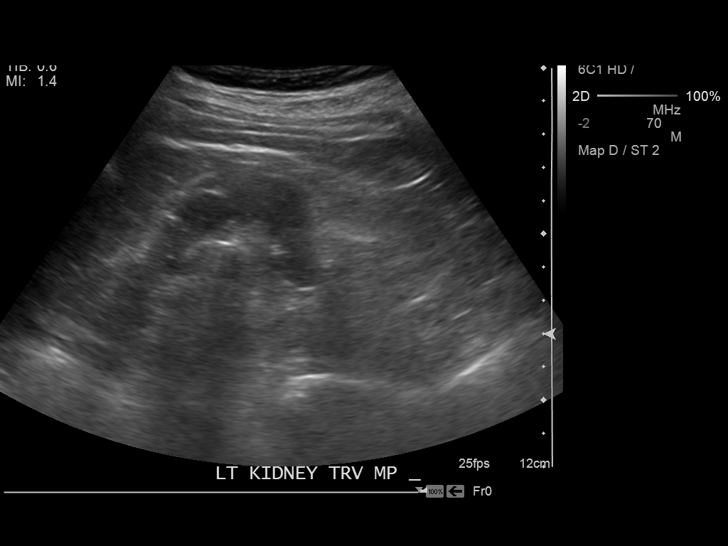
[im 71/71]
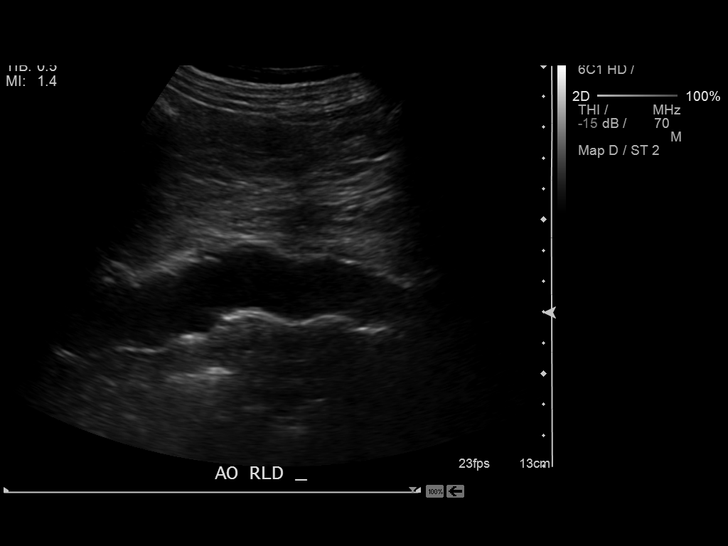

[13 of 25 positions shown; findings below may reference images not displayed]

FINDINGS: Abdominal Aorta

No aneurysm identified. The aorta is mildly irregular with echogenic
walls. Findings are consistent with atherosclerotic disease.

Maximum AP

Diameter:  2.5 cm in the proximal aorta.

Maximum TRV

Diameter: 2.3 cm in the distal abdominal aorta.

Right Common Iliac Artery

The right common iliac artery is ectatic, measuring up to 1.9 cm.
The right common iliac artery previously measured 1.7 cm in 5440.

Left Common Iliac Artery

No aneurysm identified. Left common iliac artery measures up to
cm.

IVC

No abnormality visualized.

Right Kidney

Length: 8.1 cm. Normal echogenicity without hydronephrosis. There is
an echogenic structure with shadowing in the right kidney lower
pole. This echogenic structure measures up to 1.4 cm and compatible
with a renal stone or calcification. There was a large calcification
in this region in 5440. Round hypoechoic structure in the right
kidney upper pole is suggestive for a small cyst that measures up to
1.3 cm.

Left Kidney

Length: 9.7 cm. Normal echogenicity without hydronephrosis.
Echogenic structure in the left kidney lower pole measures up to
cm and has posterior acoustic shadowing. This is compatible with a
left renal stone and similar to the previous CT.
IMPRESSION: Negative for an abdominal aortic aneurysm.

Ectasia of the right common iliac artery, measuring up to 1.9 cm,
with minimal change since 5440.

Bilateral renal calcifications or stones. These were present on the
CT from [DATE].

## 2015-05-27 DIAGNOSIS — Z85828 Personal history of other malignant neoplasm of skin: Secondary | ICD-10-CM | POA: Insufficient documentation

## 2017-01-19 DIAGNOSIS — I2510 Atherosclerotic heart disease of native coronary artery without angina pectoris: Secondary | ICD-10-CM | POA: Insufficient documentation

## 2017-10-07 ENCOUNTER — Emergency Department: Payer: Medicare Other

## 2017-10-07 ENCOUNTER — Emergency Department
Admission: EM | Admit: 2017-10-07 | Discharge: 2017-10-08 | Disposition: A | Discharge status: Home / Self Care | Payer: Medicare Other | Attending: Emergency Medicine | Admitting: Emergency Medicine

## 2017-10-07 ENCOUNTER — Encounter: Payer: Self-pay | Admitting: Emergency Medicine

## 2017-10-07 ENCOUNTER — Other Ambulatory Visit: Payer: Self-pay

## 2017-10-07 DIAGNOSIS — I129 Hypertensive chronic kidney disease with stage 1 through stage 4 chronic kidney disease, or unspecified chronic kidney disease: Secondary | ICD-10-CM | POA: Diagnosis not present

## 2017-10-07 DIAGNOSIS — N189 Chronic kidney disease, unspecified: Secondary | ICD-10-CM | POA: Diagnosis not present

## 2017-10-07 DIAGNOSIS — Z7982 Long term (current) use of aspirin: Secondary | ICD-10-CM | POA: Diagnosis not present

## 2017-10-07 DIAGNOSIS — I252 Old myocardial infarction: Secondary | ICD-10-CM | POA: Diagnosis not present

## 2017-10-07 DIAGNOSIS — Z8673 Personal history of transient ischemic attack (TIA), and cerebral infarction without residual deficits: Secondary | ICD-10-CM | POA: Insufficient documentation

## 2017-10-07 DIAGNOSIS — F419 Anxiety disorder, unspecified: Secondary | ICD-10-CM | POA: Diagnosis not present

## 2017-10-07 DIAGNOSIS — Z79899 Other long term (current) drug therapy: Secondary | ICD-10-CM | POA: Insufficient documentation

## 2017-10-07 DIAGNOSIS — Z951 Presence of aortocoronary bypass graft: Secondary | ICD-10-CM | POA: Insufficient documentation

## 2017-10-07 DIAGNOSIS — Z8546 Personal history of malignant neoplasm of prostate: Secondary | ICD-10-CM | POA: Diagnosis not present

## 2017-10-07 DIAGNOSIS — R531 Weakness: Secondary | ICD-10-CM | POA: Diagnosis not present

## 2017-10-07 DIAGNOSIS — N390 Urinary tract infection, site not specified: Secondary | ICD-10-CM | POA: Insufficient documentation

## 2017-10-07 LAB — BASIC METABOLIC PANEL
ANION GAP: 4 — AB (ref 5–15)
BUN: 26 mg/dL — ABNORMAL HIGH (ref 6–20)
CALCIUM: 8.7 mg/dL — AB (ref 8.9–10.3)
CO2: 27 mmol/L (ref 22–32)
Chloride: 109 mmol/L (ref 101–111)
Creatinine, Ser: 1.22 mg/dL (ref 0.61–1.24)
GFR, EST AFRICAN AMERICAN: 58 mL/min — AB (ref >60)
GFR, EST NON AFRICAN AMERICAN: 50 mL/min — AB (ref >60)
GLUCOSE: 156 mg/dL — AB (ref 65–99)
Potassium: 4.2 mmol/L (ref 3.5–5.1)
Sodium: 140 mmol/L (ref 135–145)

## 2017-10-07 LAB — URINALYSIS, COMPLETE (UACMP) WITH MICROSCOPIC
Bilirubin Urine: NEGATIVE
GLUCOSE, UA: NEGATIVE mg/dL
Ketones, ur: NEGATIVE mg/dL
Nitrite: NEGATIVE
PH: 5 (ref 5.0–8.0)
Protein, ur: NEGATIVE mg/dL
Specific Gravity, Urine: 1.015 (ref 1.005–1.030)

## 2017-10-07 LAB — CBC
HCT: 44.9 % (ref 40.0–52.0)
Hemoglobin: 15 g/dL (ref 13.0–18.0)
MCH: 31.2 pg (ref 26.0–34.0)
MCHC: 33.3 g/dL (ref 32.0–36.0)
MCV: 93.6 fL (ref 80.0–100.0)
Platelets: 122 10*3/uL — ABNORMAL LOW (ref 150–440)
RBC: 4.8 MIL/uL (ref 4.40–5.90)
RDW: 14.2 % (ref 11.5–14.5)
WBC: 6.6 10*3/uL (ref 3.8–10.6)

## 2017-10-07 NOTE — ED Provider Notes (Signed)
[facility] Emergency Department Provider Note  ____________________________________________   I have reviewed the triage vital signs and the nursing notes.   HISTORY  Chief Complaint Weakness   History limited by: Not Limited   HPI Terry Stewart is a 84 y.o. male who presents to the emergency department today because of concerns for weakness.  He states the weakness is most prevalent when he gets up and tries to walk.  He feels like both legs are quite weak.  He feels shaky and unsteady.  He denies any one sided weakness.  Patient states the symptoms have been present for the past few days and gradually getting worse.  He really noticed it was worse today.  Patient states he has not been eating or drinking very well recently as his wife was recently put into an Alzheimer's unit and he has been visiting her frequently.  He denies any fevers.  Denies any chest pain or shortness of breath.   Per medical record review patient has a history of CKD, CVA, MI.  Past Medical History:  Diagnosis Date  . Anxiety    attacks  . Chronic kidney disease    stones  . Dysrhythmia   . GERD (gastroesophageal reflux disease)   . Hepatitis   . History of hiatal hernia   . History of stomach ulcers   . Hypertension   . Myocardial infarction (HCC)    2002  . Neuropathy   . Prostate disorder   . Rosacea   . Stroke [facility])     There are no active problems to display for this patient.   Past Surgical History:  Procedure Laterality Date  . CATARACT EXTRACTION EXTRACAPSULAR Right 12/15/2014   Procedure: CATARACT EXTRACTION EXTRACAPSULAR WITH INTRAOCULAR LENS PLACEMENT (IOC);  Surgeon: Sallee Lange, MD;  Location: [facility] ORS;  Service: Ophthalmology;  Laterality: Right;  [place]    1:18.8 AP%    24.8 CDE   35.04  . CORONARY ARTERY BYPASS GRAFT    . EYE SURGERY     cataract,retina    Prior to Admission medications   Medication Sig Start Date End Date Taking? Authorizing  Provider  aspirin EC 81 MG tablet Take 81 mg by mouth daily.    [provider]  dutasteride (AVODART) 0.5 MG capsule Take 0.5 mg by mouth 2 (two) times a week.    [provider]  enalapril (VASOTEC) 10 MG tablet Take 10 mg by mouth daily.    [provider]  gabapentin (NEURONTIN) 300 MG capsule Take 300 mg by mouth daily.    [provider]  gabapentin (NEURONTIN) 600 MG tablet Take 600 mg by mouth at bedtime.    [provider]  omeprazole (PRILOSEC) 40 MG capsule Take 40 mg by mouth daily.    [provider]  simvastatin (ZOCOR) 40 MG tablet Take 40 mg by mouth daily.    [provider]    Allergies Codeine  History reviewed. No pertinent family history.  Social History Social History   Tobacco Use  . Smoking status: Never Smoker  . Smokeless tobacco: Never Used  Substance Use Topics  . Alcohol use: No  . Drug use: Never    Review of Systems Constitutional: No fever/chills Eyes: No visual changes. ENT: No sore throat. Cardiovascular: Denies chest pain. Respiratory: Denies shortness of breath. Gastrointestinal: No abdominal pain.  No nausea, no vomiting.  No diarrhea.   Genitourinary: Negative for dysuria. Musculoskeletal: Negative for back pain. Skin: Negative for  rash. Neurological: Positive for bilateral lower extremity weakness.  ____________________________________________   PHYSICAL EXAM:  VITAL SIGNS: ED Triage Vitals  Enc Vitals Group     BP 10/07/17 2205 (!) 193/68     Pulse Rate 10/07/17 2205 (!) 57     Resp 10/07/17 2205 18     Temp 10/07/17 2205 98.1 F (36.7 C)     Temp Source 10/07/17 2205 Oral     SpO2 10/07/17 2205 96 %     Weight 10/07/17 2206 164 lb (74.4 kg)     Height 10/07/17 2206 5\' 11"  (1.803 m)     Head Circumference --      Peak Flow --      Pain Score 10/07/17 2206 0   Constitutional: Alert and oriented. Well appearing and in no distress. Eyes: Conjunctivae are  normal.  ENT   Head: Normocephalic and atraumatic.   Nose: No congestion/rhinnorhea.   Mouth/Throat: Mucous membranes are moist.   Neck: No stridor. Hematological/Lymphatic/Immunilogical: No cervical lymphadenopathy. Cardiovascular: Normal rate, regular rhythm.  No murmurs, rubs, or gallops.  Respiratory: Normal respiratory effort without tachypnea nor retractions. Crackles noted to left lower lung.  Gastrointestinal: Soft and non tender. No rebound. No guarding.  Genitourinary: Deferred Musculoskeletal: Normal range of motion in all extremities. No lower extremity edema. Neurologic:  Normal speech and language. No gross focal neurologic deficits are appreciated.  Skin:  Skin is warm, dry and intact. No rash noted. Psychiatric: Mood and affect are normal. Speech and behavior are normal. Patient exhibits appropriate insight and judgment.  ____________________________________________    LABS (pertinent positives/negatives)  CBC wnl except plt 122 BMP na 140, k 4.2, glu 156, cr 1.22 UA large leukocytes, too numerous to count WBCs, cloudy   ________________________________________    RADIOLOGY  CT head No acute disease  CXR Left lower lobe fibroisis.   ____________________________________________   PROCEDURES  Procedures  ____________________________________________   INITIAL IMPRESSION / ASSESSMENT AND PLAN / ED COURSE  Pertinent labs & imaging results that were available during my care of the patient were reviewed by me and considered in my medical decision making (see chart for details).  Patient presented to the emergency department today because of concerns for bilateral lower extremity weakness and difficulty with ambulation.  No focal findings on exam.  Differential would be broad including anemia, electrolyte abnormality, myositis, infection amongst other etiologies.  Workup is most consistent with urinary tract infection.  I do think this could  explain some of the patient's weakness.  Will plan on giving patient IV antibiotics in the emergency department and prescription for further IV antibiotics.  Discussed findings plan with patient.   ____________________________________________   FINAL CLINICAL IMPRESSION(S) / ED DIAGNOSES  Final diagnoses:  Weakness  Lower urinary tract infectious disease     Note: This dictation was prepared with Dragon dictation. Any transcriptional errors that result from this process are unintentional     Phineas SemenGoodman, Lida Berkery, MD 10/08/17 1454

## 2017-10-07 NOTE — ED Notes (Signed)
Patient transported to X-ray 

## 2017-10-07 NOTE — ED Triage Notes (Signed)
Pt to ED via EMS from home c/o weakness and staggering that started today around 1630 or 1700 today.  Denies falls.  Hx of triple bypass, stroke, HTN, and hyperlipidemia.  Patient has previous left side smile droop otherwise no other neuro deficits.  Pt not c/o pain or SOB.  A&Ox4, chest rise even and unlabored, skin warm and dry.

## 2017-10-07 NOTE — ED Notes (Addendum)
Pt moved to room 6  ED Provider at bedside.  Pt reports bilateral leg weakness that started with [facility], pt appears oriented and alert x 4,   Left side lung side sounds diminished, left face droop after stroke approx 16 years ago

## 2017-10-08 DIAGNOSIS — R531 Weakness: Secondary | ICD-10-CM | POA: Diagnosis not present

## 2017-10-08 MED ORDER — CEPHALEXIN 500 MG PO CAPS: 500.0000 mg | ORAL_CAPSULE | Freq: Three times a day (TID) | ORAL | 0 refills | Status: DC

## 2017-10-08 MED ORDER — SODIUM CHLORIDE 0.9 % IV SOLN
1.0000 g | Freq: Once | INTRAVENOUS | Status: AC
  Administered 2017-10-08: 1 g via INTRAVENOUS
  Filled 2017-10-08: qty 10

## 2017-10-08 MED ORDER — SODIUM CHLORIDE 0.9 % IV BOLUS
1000.0000 mL | Freq: Once | INTRAVENOUS | Status: AC
  Administered 2017-10-08: 1000 mL via INTRAVENOUS

## 2017-10-08 NOTE — Discharge Instructions (Signed)
Please seek medical attention for any high fevers, chest pain, shortness of breath, change in behavior, persistent vomiting, bloody stool or any other new or concerning symptoms.  

## 2017-11-08 ENCOUNTER — Emergency Department: Payer: Medicare Other

## 2017-11-08 ENCOUNTER — Encounter: Payer: Self-pay | Admitting: Internal Medicine

## 2017-11-08 ENCOUNTER — Observation Stay
Admission: EM | Admit: 2017-11-08 | Discharge: 2017-11-10 | Disposition: A | Discharge status: Home / Self Care | Payer: Medicare Other | Attending: Internal Medicine | Admitting: Internal Medicine

## 2017-11-08 ENCOUNTER — Other Ambulatory Visit: Payer: Self-pay

## 2017-11-08 DIAGNOSIS — Z7982 Long term (current) use of aspirin: Secondary | ICD-10-CM | POA: Insufficient documentation

## 2017-11-08 DIAGNOSIS — R29810 Facial weakness: Secondary | ICD-10-CM | POA: Insufficient documentation

## 2017-11-08 DIAGNOSIS — Z885 Allergy status to narcotic agent status: Secondary | ICD-10-CM | POA: Diagnosis not present

## 2017-11-08 DIAGNOSIS — I129 Hypertensive chronic kidney disease with stage 1 through stage 4 chronic kidney disease, or unspecified chronic kidney disease: Secondary | ICD-10-CM | POA: Insufficient documentation

## 2017-11-08 DIAGNOSIS — Z91010 Allergy to peanuts: Secondary | ICD-10-CM | POA: Insufficient documentation

## 2017-11-08 DIAGNOSIS — Z79899 Other long term (current) drug therapy: Secondary | ICD-10-CM | POA: Insufficient documentation

## 2017-11-08 DIAGNOSIS — G629 Polyneuropathy, unspecified: Secondary | ICD-10-CM | POA: Insufficient documentation

## 2017-11-08 DIAGNOSIS — I2510 Atherosclerotic heart disease of native coronary artery without angina pectoris: Secondary | ICD-10-CM | POA: Diagnosis not present

## 2017-11-08 DIAGNOSIS — Z8673 Personal history of transient ischemic attack (TIA), and cerebral infarction without residual deficits: Secondary | ICD-10-CM | POA: Insufficient documentation

## 2017-11-08 DIAGNOSIS — K219 Gastro-esophageal reflux disease without esophagitis: Secondary | ICD-10-CM | POA: Diagnosis not present

## 2017-11-08 DIAGNOSIS — Z951 Presence of aortocoronary bypass graft: Secondary | ICD-10-CM | POA: Insufficient documentation

## 2017-11-08 DIAGNOSIS — N400 Enlarged prostate without lower urinary tract symptoms: Secondary | ICD-10-CM | POA: Insufficient documentation

## 2017-11-08 DIAGNOSIS — L719 Rosacea, unspecified: Secondary | ICD-10-CM | POA: Diagnosis not present

## 2017-11-08 DIAGNOSIS — Z8719 Personal history of other diseases of the digestive system: Secondary | ICD-10-CM | POA: Insufficient documentation

## 2017-11-08 DIAGNOSIS — F419 Anxiety disorder, unspecified: Secondary | ICD-10-CM | POA: Diagnosis not present

## 2017-11-08 DIAGNOSIS — R42 Dizziness and giddiness: Principal | ICD-10-CM | POA: Insufficient documentation

## 2017-11-08 DIAGNOSIS — I252 Old myocardial infarction: Secondary | ICD-10-CM | POA: Insufficient documentation

## 2017-11-08 DIAGNOSIS — Z87440 Personal history of urinary (tract) infections: Secondary | ICD-10-CM | POA: Diagnosis not present

## 2017-11-08 DIAGNOSIS — N183 Chronic kidney disease, stage 3 (moderate): Secondary | ICD-10-CM | POA: Diagnosis not present

## 2017-11-08 DIAGNOSIS — Z66 Do not resuscitate: Secondary | ICD-10-CM | POA: Insufficient documentation

## 2017-11-08 DIAGNOSIS — E785 Hyperlipidemia, unspecified: Secondary | ICD-10-CM | POA: Insufficient documentation

## 2017-11-08 DIAGNOSIS — J449 Chronic obstructive pulmonary disease, unspecified: Secondary | ICD-10-CM | POA: Diagnosis not present

## 2017-11-08 LAB — COMPREHENSIVE METABOLIC PANEL
ALBUMIN: 3.9 g/dL (ref 3.5–5.0)
ALT: 19 U/L (ref 17–63)
ANION GAP: 6 (ref 5–15)
AST: 28 U/L (ref 15–41)
Alkaline Phosphatase: 71 U/L (ref 38–126)
BILIRUBIN TOTAL: 0.9 mg/dL (ref 0.3–1.2)
BUN: 25 mg/dL — ABNORMAL HIGH (ref 6–20)
CO2: 26 mmol/L (ref 22–32)
Calcium: 8.8 mg/dL — ABNORMAL LOW (ref 8.9–10.3)
Chloride: 105 mmol/L (ref 101–111)
Creatinine, Ser: 1.14 mg/dL (ref 0.61–1.24)
GFR, EST NON AFRICAN AMERICAN: 54 mL/min — AB (ref >60)
Glucose, Bld: 100 mg/dL — ABNORMAL HIGH (ref 65–99)
POTASSIUM: 3.9 mmol/L (ref 3.5–5.1)
Sodium: 137 mmol/L (ref 135–145)
TOTAL PROTEIN: 7.1 g/dL (ref 6.5–8.1)

## 2017-11-08 LAB — CBC WITH DIFFERENTIAL/PLATELET
BASOS PCT: 1 %
Basophils Absolute: 0 10*3/uL (ref 0–0.1)
Eosinophils Absolute: 0.2 10*3/uL (ref 0–0.7)
Eosinophils Relative: 3 %
HEMATOCRIT: 44.9 % (ref 40.0–52.0)
HEMOGLOBIN: 15.2 g/dL (ref 13.0–18.0)
Lymphocytes Relative: 26 %
Lymphs Abs: 1.9 10*3/uL (ref 1.0–3.6)
MCH: 31.9 pg (ref 26.0–34.0)
MCHC: 33.7 g/dL (ref 32.0–36.0)
MCV: 94.6 fL (ref 80.0–100.0)
Monocytes Absolute: 0.9 10*3/uL (ref 0.2–1.0)
Monocytes Relative: 12 %
NEUTROS ABS: 4.4 10*3/uL (ref 1.4–6.5)
NEUTROS PCT: 58 %
Platelets: 142 10*3/uL — ABNORMAL LOW (ref 150–440)
RBC: 4.75 MIL/uL (ref 4.40–5.90)
RDW: 13.7 % (ref 11.5–14.5)
WBC: 7.6 10*3/uL (ref 3.8–10.6)

## 2017-11-08 LAB — TROPONIN I

## 2017-11-08 MED ORDER — SODIUM CHLORIDE 0.9 % IV BOLUS
500.0000 mL | Freq: Once | INTRAVENOUS | Status: AC
  Administered 2017-11-08: 500 mL via INTRAVENOUS

## 2017-11-08 MED ORDER — MECLIZINE HCL 25 MG PO TABS
12.5000 mg | ORAL_TABLET | Freq: Once | ORAL | Status: AC
  Administered 2017-11-08: 12.5 mg via ORAL
  Filled 2017-11-08: qty 1

## 2017-11-08 NOTE — ED Notes (Signed)
Pt to the er for dizziness. Pt states he was dx with a UTI and started his antibiotics but has not been drinking the water that he should. Pt states that when he turns his head to the left, he feels as if he will fall to the left. Pt reports dizziness on standing. Pt denies N/V/D, chest pain or headache. Equal grips. Pt has a hx of a stroke.

## 2017-11-08 NOTE — H&P (Signed)
[facility] [place] [facility]   PATIENT NAME: Terry Stewart    MR#:  [id number]  DATE OF BIRTH:  1924-03-09  DATE OF ADMISSION:  11/08/2017  PRIMARY CARE PHYSICIAN: Jaclyn Shaggy, MD   REQUESTING/REFERRING PHYSICIAN: Dionne Bucy, MD  CHIEF COMPLAINT:   Chief Complaint  Patient presents with  . Dizziness    HISTORY OF PRESENT ILLNESS:  Terry Stewart  is a 84 y.o. male with a known history of HTN, HLD, CAD/MI (s/p CABG x3, 2002), TIA/CVA, COPD/ILD, GERD, BPH, peripheral neuropathy, ACE-I-induced cough p/w 1d Hx vertigo/imbalance. Pt is AAOx3, appears younger than stated age, and is a very good historian. He is accompanied by his daughter, who is at bedside. She tells me the pt still mows his own yard. He ambulates w/ a cane (and at times uses his wife's old walker), and his daughter states he has not had any falls. The pt is well-appearing, and is in no acute distress. He states that he visited the ED ~22mo ago (10/07/2017), and was Dx w/ UTI, Rx Keflex and D/Ced. At that time, the pt had gone to the ED w/ c/o generalized weakness and unsteadiness. He endorses similar symptoms on present visit, but endorses additional complaints.  Pt states he saw his PCP last Friday (11/03/2017) for routine checkup and labwork. He states a U/A performed at the time was (+) UTI, and he was Rx Cefuroxime  PO BID. He has since felt reasonably well, up until the date of admission (Wednesday 11/08/2017). Pt states he lives by himself, and based on his description, manages all ADLs independently, as well as most of his IADLs (w/ minimal assistance). He was cooking supper, and got up to check on his food. He states he tried to get up from the sitting position slowly, but felt as though he was going to fall to his left side and into the wall. He sat back down immediately. He states he remained seated for 3-4 minutes, then tried to stand up again, but again felt as though he was going to  fall to his L side, and sat back down. He contacted his daughter, who brought him to the ED. In the ED, the pt states he developed frank vertigo while trying to get up out of bed to go to the bathroom. He describes the sensation as the room spinning around him, worse w/ positional change, head movement and ambulation. His symptoms have resolved at the time of my Hx/examination. He received Meclizine in the ED.  Pt endorses baseline peripheral neuropathy, w/ abnormal (decreased) sensation below the knees B/L, w/ intermittent burning sensation in the B/L thighs, improved w/ Gabapentin, unchanged from baseline on present admission. He denies memory loss or incontinence. He denies back pain or saddle anesthesia. He denies wide-based gait or frequent falls. He denies headache, blurred vision or changes in vision, hearing loss or changes in hearing, tinnitus, unilateral weakness, facial droop, slurred speech, aphasia, ataxia, LH or LOC. He does endorse poor PO intake since his wife was placed in a nursing home 2/2 dementia (in 09/2017), but he states he is feeling reasonably well, and is otherwise w/o complaint. (-) F/C/N/V/D/AP, CP, SOB, palpitations, diaphoresis, rigors, night sweats, weight loss, urinary symptoms.  PAST MEDICAL HISTORY:   Past Medical History:  Diagnosis Date  . Anxiety    attacks  . Chronic kidney disease    stones  . Dysrhythmia   . GERD (gastroesophageal reflux disease)   . Hepatitis   .  History of hiatal hernia   . History of stomach ulcers   . Hypertension   . Myocardial infarction (HCC)    2002  . Neuropathy   . Prostate disorder   . Rosacea   . Stroke [facility])     PAST SURGICAL HISTORY:   Past Surgical History:  Procedure Laterality Date  . CATARACT EXTRACTION EXTRACAPSULAR Right 12/15/2014   Procedure: CATARACT EXTRACTION EXTRACAPSULAR WITH INTRAOCULAR LENS PLACEMENT (IOC);  Surgeon: Sallee Lange, MD;  Location: [facility] ORS;  Service: Ophthalmology;  Laterality:  Right;  [place]    1:18.8 AP%    24.8 CDE   35.04  . CORONARY ARTERY BYPASS GRAFT    . EYE SURGERY     cataract,retina    SOCIAL HISTORY:   Social History   Tobacco Use  . Smoking status: Never Smoker  . Smokeless tobacco: Never Used  Substance Use Topics  . Alcohol use: No    FAMILY HISTORY:  History reviewed. No pertinent family history.  Family History  . Myocardial Infarction (Heart attack) Father  . Ulcers Father  . Cancer Sister  . Heart failure Sister  . Cancer Brother   DRUG ALLERGIES:   Allergies  Allergen Reactions  . Peanut-Containing Drug Products Anaphylaxis  . Codeine Other (See Comments)    hallucinations    REVIEW OF SYSTEMS:   Review of Systems  Constitutional: Negative for chills, diaphoresis, fever, malaise/fatigue and weight loss.  HENT: Negative for congestion, ear pain, hearing loss, nosebleeds, sinus pain, sore throat and tinnitus.   Eyes: Negative for blurred vision, double vision and photophobia.  Respiratory: Negative for cough, hemoptysis, sputum production, shortness of breath and wheezing.   Cardiovascular: Negative for chest pain, palpitations, orthopnea, claudication, leg swelling and PND.  Gastrointestinal: Negative for abdominal pain, blood in stool, constipation, diarrhea, heartburn, melena, nausea and vomiting.  Genitourinary: Negative for dysuria, frequency, hematuria and urgency.  Musculoskeletal: Negative for back pain, falls, joint pain, myalgias and neck pain.  Skin: Negative for itching and rash.  Neurological: Positive for dizziness (+) vertigo and weakness (+) generalized weakness/leg weakness. Negative for tingling, tremors, sensory change, speech change, focal weakness, seizures, loss of consciousness and headaches.   MEDICATIONS AT HOME:   Prior to Admission medications   Medication Sig Start Date End Date Taking? Authorizing Provider  aspirin EC 81 MG tablet Take 81 mg by mouth daily.    [provider]    cefUROXime (CEFTIN) 500 MG tablet Take 500 mg by mouth 2 (two) times daily. 11/06/17   [provider]  cephALEXin (KEFLEX) 500 MG capsule Take 1 capsule (500 mg total) by mouth 3 (three) times daily. 10/08/17   Phineas Semen, MD  dutasteride (AVODART) 0.5 MG capsule Take 0.5 mg by mouth 2 (two) times a week.    [provider]  enalapril (VASOTEC) 10 MG tablet Take 10 mg by mouth daily.    [provider]  gabapentin (NEURONTIN) 300 MG capsule Take 300 mg by mouth daily.    [provider]  gabapentin (NEURONTIN) 600 MG tablet Take 600 mg by mouth at bedtime.    [provider]  omeprazole (PRILOSEC) 40 MG capsule Take 40 mg by mouth daily.    [provider]  simvastatin (ZOCOR) 40 MG tablet Take 40 mg by mouth daily.    [provider]      VITAL SIGNS:  Blood pressure (!) 178/91, pulse 75, temperature (!) 97 F (36.1 C), temperature source Oral, resp. rate (!) 22,  height  (1.803 m), weight 71.7 kg (158 lb), SpO2 96 %.  PHYSICAL EXAMINATION:  Physical Exam  Constitutional: He is oriented to person, place, and time. He appears well-developed and well-nourished. He is active and cooperative.  Non-toxic appearance. He does not have a sickly appearance. He does not appear ill. No distress.  HENT:  Head: Normocephalic and atraumatic.  Mouth/Throat: No oropharyngeal exudate.  Eyes: Conjunctivae and lids are normal. No scleral icterus. Right eye exhibits no nystagmus. Left eye exhibits no nystagmus.  Neck: Neck supple. No JVD present. No thyromegaly present.  Cardiovascular: Normal rate, regular rhythm, S1 normal, S2 normal and normal heart sounds.  No extrasystoles are present. Exam reveals no gallop, no S3, no S4, no distant heart sounds and no friction rub.  No murmur heard. Pulmonary/Chest: Effort normal. No accessory muscle usage or stridor. No apnea, no tachypnea and no bradypnea. No respiratory distress. He has  decreased breath sounds in the right upper field, the right middle field, the right lower field, the left upper field, the left middle field and the left lower field. He has no wheezes. He has no rhonchi. He has rales in the right middle field, the right lower field, the left middle field and the left lower field. He exhibits no tenderness.  (+) diffusely diminished breath sounds w/ fine crackles in lower lung fields (suspected baseline physical exam findings 2/2 Hx ILD, seen on prior + current CXR).  Abdominal: Soft. Bowel sounds are normal. He exhibits no distension. There is no tenderness. There is no guarding.  Musculoskeletal: Normal range of motion. He exhibits no edema or tenderness.  Lymphadenopathy:    He has no cervical adenopathy.  Neurological: He is alert and oriented to person, place, and time. He has normal strength. He displays no tremor and normal reflexes. No cranial nerve deficit or sensory deficit. He exhibits normal muscle tone. Coordination normal.  Skin: Skin is warm and dry. No rash noted. He is not diaphoretic. No erythema.  (+) bandage L wrist (2/2 skin Bx by Vaughan Sine); (+) L ear bandage (2/2 skin Bx by Vaughan Sine); L forehead lesion (lateral to L eye; 2/2 skin lesion cautery by Derm); R elbow lesion (2/2 skin lesion cautery by Derm).  Psychiatric: He has a normal mood and affect. His speech is normal and behavior is normal. Judgment and thought content normal. Cognition and memory are normal. He is attentive.   LABORATORY PANEL:   CBC Recent Labs  Lab 11/08/17 1954  WBC 7.6  HGB 15.2  HCT 44.9  PLT 142*   ------------------------------------------------------------------------------------------------------------------  Chemistries  Recent Labs  Lab 11/08/17 1954  NA 137  K 3.9  CL 105  CO2 26  GLUCOSE 100*  BUN 25*  CREATININE 1.14  CALCIUM 8.8*  AST 28  ALT 19  ALKPHOS 71  BILITOT 0.9    ------------------------------------------------------------------------------------------------------------------  Cardiac Enzymes Recent Labs  Lab 11/08/17 1954  TROPONINI <0.03   ------------------------------------------------------------------------------------------------------------------  RADIOLOGY:  Ct Head Wo Contrast  Result Date: 11/08/2017 CLINICAL DATA:  Dizziness EXAM: CT HEAD WITHOUT CONTRAST TECHNIQUE: Contiguous axial images were obtained from the base of the skull through the vertex without intravenous contrast. COMPARISON:  10/07/2017 and 04/26/2011 FINDINGS: BRAIN: There is sulcal and ventricular prominence consistent with superficial and central atrophy. No intraparenchymal hemorrhage, mass effect nor midline shift. Periventricular and subcortical white matter hypodensities consistent with chronic small vessel ischemic disease are identified. No acute large vascular territory infarcts. No abnormal extra-axial fluid collections. Basal cisterns are not  effaced and midline. Chronic left basal ganglial and probable bilateral thalamic lacunar infarcts. VASCULAR: Moderate calcific atherosclerosis of the carotid siphons. SKULL: No skull fracture. No significant scalp soft tissue swelling. SINUSES/ORBITS: The mastoid air-cells are clear. The included paranasal sinuses are well-aerated.The included ocular globes and orbital contents are non-suspicious. Bilateral lens replacements. OTHER: None. IMPRESSION: Atrophy with chronic small vessel ischemia. No acute intracranial abnormality. Electronically Signed   By: Tollie Eth M.D.   On: 11/08/2017 23:47   IMPRESSION AND PLAN:   A/P: 16M p/w 1d Hx vertigo.  1.) Vertigo: Pt p/w 1d Hx positional vertigo, exacerbated by activity/head movement. Rececived Meclizine in ED, asymptomatic at the time of my Hx/examination. (-) Nystagmus. Neurologically non-focal, examination largely unimpressive. CT head performed in ED (-) acute intracranial  pathology. Pt on ASA + Statin. Suspect BPPV. Very low suspicion for CVA, workup w/ MRI unlikely to change management or improve outcomes. Tx symptomatically, c/w Meclizine; other options include Epley maneuver/ENT evaluation/vestibular rehab if symptoms recurrent/persistent.  2.) UTI: Recently Dx w/ UTI (Friday 11/03/2017), started on Cefuroxime. Expected treatment duration 7-10d, formulary substitution Cefdinir  BID.  3.) Cr elevation/CKD III: Pt w/ Cr 1.14 on admission. Appears to be at baseline based on review of prior labwork. Expect pt w/ underlying CKD III (2/2 HTN, aged kidney). Avoid nephrotoxins.  4.) Hyperglycemia: Glucose 100, borderline. Monitor.  5.) ILD: Fine crackles on lung exam. CXR (+) fibrosis + stable calcified granuloma in both lung bases, chronic changes. Non-hypoxic, denies SOB.  6.) HTN: c/w Enalapril.  7.) HLD/CAD/MI/TIA/CVA: c/w ASA, Statin, Enalapril.  8.) GERD: Protonix (FS Omeprazole).  9.) BPH: c/w Dutasteride.  10.) Peripheral neuropathy: c/w Gabapentin.  11.) FEN/GI: Cardiac diet, Protonix.  12.) DVT PPx: Lovenox  SQ qD.  13.) Code status: Full code.  14.) Disposition: Observation, pt expected to stay < 2 midnights.   All the records are reviewed and case discussed with ED provider. Management plans discussed with the patient, family and they are in agreement.  CODE STATUS: Full code.  TOTAL TIME TAKING CARE OF THIS PATIENT: 90 minutes.    Barbaraann Rondo M.D on 11/08/2017 at 11:59 PM  Between 7am to 6pm - Pager - [phone]  After 6pm go to [email or web address] - Social research officer, government  [employer] [facility]  Office  [phone]  CC: Primary care physician; Jaclyn Shaggy, MD   Note: This dictation was prepared with Dragon dictation along with smaller phrase technology. Any transcriptional errors that result from this process are unintentional.

## 2017-11-08 NOTE — ED Notes (Signed)
Pt able to stand with this RN to use urinal. Pt says he doesn't feel dizzy but feels off in his head.

## 2017-11-08 NOTE — ED Provider Notes (Signed)
[facility] Emergency Department Provider Note ____________________________________________   First MD Initiated Contact with Patient 11/08/17 2019     (approximate)  I have reviewed the triage vital signs and the nursing notes.   HISTORY  Chief Complaint Dizziness    HPI Zacari Stiff Agostinelli is a 84 y.o. male with PMH as noted below who presents with dizziness, described as a sensation of movement or like things are rushing in front of him, worse when he turns his head to the left, and worse when he is standing up.  Onset is relatively acute, and the duration is one day.  He reports mild generalized weakness.  He denies nausea, vomiting, diarrhea, or fever.  He denies chest pain or difficulty breathing.  Patient is currently being treated for UTI with oral antibiotics.  Past Medical History:  Diagnosis Date  . Anxiety    attacks  . Chronic kidney disease    stones  . Dysrhythmia   . GERD (gastroesophageal reflux disease)   . Hepatitis   . History of hiatal hernia   . History of stomach ulcers   . Hypertension   . Myocardial infarction (HCC)    2002  . Neuropathy   . Prostate disorder   . Rosacea   . Stroke [facility])     There are no active problems to display for this patient.   Past Surgical History:  Procedure Laterality Date  . CATARACT EXTRACTION EXTRACAPSULAR Right 12/15/2014   Procedure: CATARACT EXTRACTION EXTRACAPSULAR WITH INTRAOCULAR LENS PLACEMENT (IOC);  Surgeon: Sallee Lange, MD;  Location: [facility] ORS;  Service: Ophthalmology;  Laterality: Right;  [place]    1:18.8 AP%    24.8 CDE   35.04  . CORONARY ARTERY BYPASS GRAFT    . EYE SURGERY     cataract,retina    Prior to Admission medications   Medication Sig Start Date End Date Taking? Authorizing Provider  aspirin EC 81 MG tablet Take 81 mg by mouth daily.    [provider]  cefUROXime (CEFTIN) 500 MG tablet Take 500 mg by mouth 2 (two) times daily. 11/06/17   [provider]  cephALEXin (KEFLEX) 500 MG capsule Take 1 capsule (500 mg total) by mouth 3 (three) times daily. 10/08/17   Phineas Semen, MD  dutasteride (AVODART) 0.5 MG capsule Take 0.5 mg by mouth 2 (two) times a week.    [provider]  enalapril (VASOTEC) 10 MG tablet Take 10 mg by mouth daily.    [provider]  gabapentin (NEURONTIN) 300 MG capsule Take 300 mg by mouth daily.    [provider]  gabapentin (NEURONTIN) 600 MG tablet Take 600 mg by mouth at bedtime.    [provider]  omeprazole (PRILOSEC) 40 MG capsule Take 40 mg by mouth daily.    [provider]  simvastatin (ZOCOR) 40 MG tablet Take 40 mg by mouth daily.    [provider]    Allergies Peanut-containing drug products and Codeine  No family history on file.  Social History Social History   Tobacco Use  . Smoking status: Never Smoker  . Smokeless tobacco: Never Used  Substance Use Topics  . Alcohol use: No  . Drug use: Never    Review of Systems  Constitutional: No fever. Eyes: No visual changes. ENT: No neck pain. Cardiovascular: Denies chest pain. Respiratory: Denies shortness of breath. Gastrointestinal: No nausea, no vomiting.  No diarrhea.  Genitourinary: Negative for flank pain.  Musculoskeletal: Negative for  back pain. Skin: Negative for rash. Neurological: Negative for headache.   ____________________________________________   PHYSICAL EXAM:  VITAL SIGNS: ED Triage Vitals  Enc Vitals Group     BP 11/08/17 1945 (!) 210/80     Pulse Rate 11/08/17 1945 63     Resp 11/08/17 1945 16     Temp 11/08/17 1945 (!) 97 F (36.1 C)     Temp Source 11/08/17 1945 Oral     SpO2 11/08/17 1944 99 %     Weight 11/08/17 1947 158 lb (71.7 kg)     Height 11/08/17 1947  (1.803 m)     Head Circumference --      Peak Flow --      Pain Score 11/08/17 1946 0     Pain Loc --      Pain Edu? --      Excl. in GC? --     Constitutional:  Alert and oriented.  Relatively well-appearing for age and in no acute distress. Eyes: Conjunctivae are normal.  EOMI.  PERRLA. Head: Atraumatic. Nose: No congestion/rhinnorhea. Mouth/Throat: Mucous membranes are slightly dry.   Neck: Normal range of motion.  Cardiovascular: Normal rate, regular rhythm. Grossly normal heart sounds.  Good peripheral circulation. Respiratory: Normal respiratory effort.  No retractions. Lungs CTAB. Gastrointestinal: Soft and nontender. No distention.  Genitourinary: No flank tenderness. Musculoskeletal: No lower extremity edema.  Extremities warm and well perfused.  Neurologic:  Normal speech and language.  Motor intact in all extremities.  Normal coordination with no ataxia.  No gross focal neurologic deficits are appreciated.  Skin:  Skin is warm and dry. No rash noted. Psychiatric: Mood and affect are normal. Speech and behavior are normal.  ____________________________________________   LABS (all labs ordered are listed, but only abnormal results are displayed)  Labs Reviewed  COMPREHENSIVE METABOLIC PANEL - Abnormal; Notable for the following components:      Result Value   Glucose, Bld 100 (*)    BUN 25 (*)    Calcium 8.8 (*)    GFR calc non Af Amer 54 (*)    All other components within normal limits  CBC WITH DIFFERENTIAL/PLATELET - Abnormal; Notable for the following components:   Platelets 142 (*)    All other components within normal limits  TROPONIN I   ____________________________________________  EKG  ED ECG REPORT I, Dionne Bucy, the attending physician, personally viewed and interpreted this ECG.  Date: 11/08/2017 EKG Time: 1948 Rate: 76 Rhythm: normal sinus rhythm with PVCs QRS Axis: normal Intervals: normal ST/T Wave abnormalities: normal Narrative Interpretation: no evidence of acute ischemia; no recent prior EKG available for comparison  ____________________________________________  RADIOLOGY  CT head:  Pending  ____________________________________________   PROCEDURES  Procedure(s) performed: No  Procedures  Critical Care performed: No ____________________________________________   INITIAL IMPRESSION / ASSESSMENT AND PLAN / ED COURSE  Pertinent labs & imaging results that were available during my care of the patient were reviewed by me and considered in my medical decision making (see chart for details).  84 year old male with PMH as noted above presents with dizziness, described as a movement sensation and worse with certain positions and with trying to walk.  He states he is currently being treated for UTI.  I reviewed the past medical records in Epic; patient was seen in the ED 1 month ago for somewhat similar presentation.  He was diagnosed with UTI at that time and treated with antibiotics.  Patient states that this resolved and he was  off of medication for a while but in the last few days his urine once again showed signs of infection so he was restarted on p.o. Cefuroxime.  On exam he is relatively well-appearing for age.  He is hypertensive with slightly elevated respiratory rate but his other vital signs are normal.  He is comfortable appearing.  Presentation is most consistent with peripheral vertigo, and patient states that he has had similar symptoms a few times before but not as severe as today.  However differential also includes dehydration, electrolyte abnormality, worsening infection, or less likely cardiac etiology.  We will give fluids and meclizine, obtain basic labs and troponin, and reassess.   ----------------------------------------- 11:17 PM on 11/08/2017 -----------------------------------------  Patient reports no significant improvement in his symptoms after meclizine.  He try to get up to the bathroom and felt very dizzy and unsteady once again.  His lab work-up is unremarkable.  Presentation continues to be most consistent with peripheral vertigo.  The  remainder of the patient's neuro exam is normal so I do not suspect stroke.  Given that he lives alone and is still too unsteady on his feet, he is appropriate for admission for further treatment and observation.  Patient agrees with this plan.  I signed the patient out to the hospitalist Dr. Marjie Skiff.    ____________________________________________   FINAL CLINICAL IMPRESSION(S) / ED DIAGNOSES  Final diagnoses:  Vertigo      NEW MEDICATIONS STARTED DURING THIS VISIT:  New Prescriptions   No medications on file     Note:  This document was prepared using Dragon voice recognition software and may include unintentional dictation errors.    Dionne Bucy, MD 11/08/17 2318

## 2017-11-09 DIAGNOSIS — R42 Dizziness and giddiness: Secondary | ICD-10-CM | POA: Diagnosis not present

## 2017-11-09 MED ORDER — CEFDINIR 300 MG PO CAPS
300.0000 mg | ORAL_CAPSULE | Freq: Two times a day (BID) | ORAL | Status: DC
  Administered 2017-11-09 – 2017-11-10 (×3): 300 mg via ORAL
  Filled 2017-11-09 (×5): qty 1

## 2017-11-09 MED ORDER — LOSARTAN POTASSIUM 25 MG PO TABS
25.0000 mg | ORAL_TABLET | Freq: Every day | ORAL | Status: DC
  Administered 2017-11-09 – 2017-11-10 (×2): 25 mg via ORAL
  Filled 2017-11-09 (×2): qty 1

## 2017-11-09 MED ORDER — AMLODIPINE BESYLATE 5 MG PO TABS
5.0000 mg | ORAL_TABLET | Freq: Once | ORAL | Status: AC
  Administered 2017-11-09: 5 mg via ORAL
  Filled 2017-11-09: qty 1

## 2017-11-09 MED ORDER — BISACODYL 5 MG PO TBEC: 5.0000 mg | DELAYED_RELEASE_TABLET | Freq: Every day | ORAL | Status: DC | PRN

## 2017-11-09 MED ORDER — PANTOPRAZOLE SODIUM 40 MG PO TBEC
40.0000 mg | DELAYED_RELEASE_TABLET | Freq: Every day | ORAL | Status: DC
  Administered 2017-11-09 – 2017-11-10 (×2): 40 mg via ORAL
  Filled 2017-11-09 (×2): qty 1

## 2017-11-09 MED ORDER — DUTASTERIDE 0.5 MG PO CAPS
0.5000 mg | ORAL_CAPSULE | ORAL | Status: DC
  Administered 2017-11-09: 0.5 mg via ORAL
  Filled 2017-11-09: qty 1

## 2017-11-09 MED ORDER — ACETAMINOPHEN 325 MG PO TABS: 650.0000 mg | ORAL_TABLET | Freq: Four times a day (QID) | ORAL | Status: DC | PRN

## 2017-11-09 MED ORDER — GABAPENTIN 300 MG PO CAPS: 300.0000 mg | ORAL_CAPSULE | Freq: Every day | ORAL | Status: DC

## 2017-11-09 MED ORDER — MECLIZINE HCL 25 MG PO TABS: 25.0000 mg | ORAL_TABLET | Freq: Three times a day (TID) | ORAL | 0 refills | Status: AC | PRN

## 2017-11-09 MED ORDER — SENNOSIDES-DOCUSATE SODIUM 8.6-50 MG PO TABS: 1.0000 | ORAL_TABLET | Freq: Every evening | ORAL | Status: DC | PRN

## 2017-11-09 MED ORDER — GABAPENTIN 300 MG PO CAPS
300.0000 mg | ORAL_CAPSULE | Freq: Every day | ORAL | Status: DC
  Administered 2017-11-09 – 2017-11-10 (×2): 300 mg via ORAL
  Filled 2017-11-09 (×2): qty 1

## 2017-11-09 MED ORDER — ACETAMINOPHEN 650 MG RE SUPP: 650.0000 mg | Freq: Four times a day (QID) | RECTAL | Status: DC | PRN

## 2017-11-09 MED ORDER — ENOXAPARIN SODIUM 40 MG/0.4ML ~~LOC~~ SOLN
40.0000 mg | SUBCUTANEOUS | Status: DC
  Administered 2017-11-09 (×2): 40 mg via SUBCUTANEOUS
  Filled 2017-11-09 (×2): qty 0.4

## 2017-11-09 MED ORDER — ONDANSETRON HCL 4 MG/2ML IJ SOLN: 4.0000 mg | Freq: Four times a day (QID) | INTRAMUSCULAR | Status: DC | PRN

## 2017-11-09 MED ORDER — GABAPENTIN 600 MG PO TABS
600.0000 mg | ORAL_TABLET | Freq: Every day | ORAL | Status: DC
  Administered 2017-11-09: 600 mg via ORAL
  Filled 2017-11-09 (×3): qty 1

## 2017-11-09 MED ORDER — ENALAPRIL MALEATE 10 MG PO TABS
10.0000 mg | ORAL_TABLET | Freq: Every day | ORAL | Status: DC
  Filled 2017-11-09: qty 1

## 2017-11-09 MED ORDER — SIMVASTATIN 10 MG PO TABS
40.0000 mg | ORAL_TABLET | Freq: Every day | ORAL | Status: DC
  Administered 2017-11-09 – 2017-11-10 (×2): 40 mg via ORAL
  Filled 2017-11-09 (×2): qty 4

## 2017-11-09 MED ORDER — ASPIRIN EC 81 MG PO TBEC
81.0000 mg | DELAYED_RELEASE_TABLET | Freq: Every day | ORAL | Status: DC
  Administered 2017-11-09 – 2017-11-10 (×2): 81 mg via ORAL
  Filled 2017-11-09 (×2): qty 1

## 2017-11-09 MED ORDER — ONDANSETRON HCL 4 MG PO TABS: 4.0000 mg | ORAL_TABLET | Freq: Four times a day (QID) | ORAL | Status: DC | PRN

## 2017-11-09 MED ORDER — MECLIZINE HCL 25 MG PO TABS
25.0000 mg | ORAL_TABLET | Freq: Three times a day (TID) | ORAL | Status: DC | PRN
  Administered 2017-11-09 – 2017-11-10 (×2): 25 mg via ORAL
  Filled 2017-11-09 (×2): qty 1

## 2017-11-09 NOTE — ED Notes (Signed)
Family at bedside. 

## 2017-11-09 NOTE — ED Notes (Signed)
Pt reports burning with urination now.

## 2017-11-09 NOTE — Progress Notes (Signed)
ADMISSION NOTE:  Pt admitted to room 143 from ER. Pt alert and oriented X4, Skin assessment complete. No complaints of pain. Sacral dressing applied. Bed in lowest position call bell in reach and bed alarm on.

## 2017-11-09 NOTE — Progress Notes (Signed)
[facility] -  at [facility]   PATIENT NAME: Terry Stewart    MR#:  [id number]  DATE OF BIRTH:  1924-04-19  SUBJECTIVE:  CHIEF COMPLAINT:   Chief Complaint  Patient presents with  . Dizziness   Says he feels strange in his head. Chronic left facial droop  REVIEW OF SYSTEMS:    Review of Systems  Constitutional: Positive for malaise/fatigue. Negative for chills and fever.  HENT: Negative for sore throat.   Eyes: Negative for blurred vision, double vision and pain.  Respiratory: Negative for cough, hemoptysis, shortness of breath and wheezing.   Cardiovascular: Negative for chest pain, palpitations, orthopnea and leg swelling.  Gastrointestinal: Negative for abdominal pain, constipation, diarrhea, heartburn, nausea and vomiting.  Genitourinary: Negative for dysuria and hematuria.  Musculoskeletal: Negative for back pain and joint pain.  Skin: Negative for rash.  Neurological: Positive for dizziness. Negative for sensory change, speech change, focal weakness and headaches.  Endo/Heme/Allergies: Does not bruise/bleed easily.  Psychiatric/Behavioral: Negative for depression. The patient is not nervous/anxious.     DRUG ALLERGIES:   Allergies  Allergen Reactions  . Peanut-Containing Drug Products Anaphylaxis  . Codeine Other (See Comments)    hallucinations    VITALS:  Blood pressure 118/69, pulse 60, temperature 98.1 F (36.7 C), temperature source Oral, resp. rate 18, height  (1.803 m), weight 71.7 kg (158 lb), SpO2 97 %.  PHYSICAL EXAMINATION:   Physical Exam  GENERAL:  84 y.o.-year-old patient lying in the bed with no acute distress.  EYES: Pupils equal, round, reactive to light and accommodation. No scleral icterus. Extraocular muscles intact.  HEENT: Head atraumatic, normocephalic. Oropharynx and nasopharynx clear.  NECK:  Supple, no jugular venous distention. No thyroid enlargement, no tenderness.  LUNGS: Normal breath sounds  bilaterally, no wheezing, rales, rhonchi. No use of accessory muscles of respiration.  CARDIOVASCULAR: S1, S2 normal. No murmurs, rubs, or gallops.  ABDOMEN: Soft, nontender, nondistended. Bowel sounds present. No organomegaly or mass.  EXTREMITIES: No cyanosis, clubbing or edema b/l.    NEUROLOGIC: Cranial nerves II through XII are intact. No focal Motor or sensory deficits b/l.   PSYCHIATRIC: The patient is alert and oriented x 3.  SKIN: No obvious rash, lesion, or ulcer.   LABORATORY PANEL:   CBC Recent Labs  Lab 11/08/17 1954  WBC 7.6  HGB 15.2  HCT 44.9  PLT 142*   ------------------------------------------------------------------------------------------------------------------ Chemistries  Recent Labs  Lab 11/08/17 1954  NA 137  K 3.9  CL 105  CO2 26  GLUCOSE 100*  BUN 25*  CREATININE 1.14  CALCIUM 8.8*  AST 28  ALT 19  ALKPHOS 71  BILITOT 0.9   ------------------------------------------------------------------------------------------------------------------  Cardiac Enzymes Recent Labs  Lab 11/08/17 1954  TROPONINI <0.03   ------------------------------------------------------------------------------------------------------------------  RADIOLOGY:  Ct Head Wo Contrast  Result Date: 11/08/2017 CLINICAL DATA:  Dizziness EXAM: CT HEAD WITHOUT CONTRAST TECHNIQUE: Contiguous axial images were obtained from the base of the skull through the vertex without intravenous contrast. COMPARISON:  10/07/2017 and 04/26/2011 FINDINGS: BRAIN: There is sulcal and ventricular prominence consistent with superficial and central atrophy. No intraparenchymal hemorrhage, mass effect nor midline shift. Periventricular and subcortical white matter hypodensities consistent with chronic small vessel ischemic disease are identified. No acute large vascular territory infarcts. No abnormal extra-axial fluid collections. Basal cisterns are not effaced and midline. Chronic left basal ganglial  and probable bilateral thalamic lacunar infarcts. VASCULAR: Moderate calcific atherosclerosis of the carotid siphons. SKULL: No skull fracture. No significant scalp soft  tissue swelling. SINUSES/ORBITS: The mastoid air-cells are clear. The included paranasal sinuses are well-aerated.The included ocular globes and orbital contents are non-suspicious. Bilateral lens replacements. OTHER: None. IMPRESSION: Atrophy with chronic small vessel ischemia. No acute intracranial abnormality. Electronically Signed   By: Tollie Eth M.D.   On: 11/08/2017 23:47     ASSESSMENT AND PLAN:   * Dizziness Improved but still has symptoms MRI brain ordered Meclizine PRN  * HTN Home meds   All the records are reviewed and case discussed with Care Management/Social Workerr. Management plans discussed with the patient, family and they are in agreement.  CODE STATUS: FULL CODE  DVT Prophylaxis: SCDs  TOTAL TIME TAKING CARE OF THIS PATIENT: 40 minutes.   POSSIBLE D/C IN 1-2 DAYS, DEPENDING ON CLINICAL CONDITION.  Molinda Bailiff Chevie Birkhead M.D on 11/09/2017 at 10:55 PM  Between 7am to 6pm - Pager - [phone]  After 6pm go to [email or web address] - password [email or web address] [facility]  [facility]  Office  [phone]  CC: Primary care physician; Jaclyn Shaggy, MD  Note: This dictation was prepared with Dragon dictation along with smaller phrase technology. Any transcriptional errors that result from this process are unintentional.

## 2017-11-09 NOTE — Evaluation (Signed)
Physical Therapy Evaluation Patient Details Name: Terry Stewart MRN: [id number] DOB: August 17, 1923 Today's Date: 11/09/2017   History of Present Illness  Pt is a 84 y.o. male presenting to hospital 11/08/17 with acute sudden dizziness x1 day (worse turning head to L and also when standing up); also currently being treated for UTI.  Pt admitted with vertigo, UTI, and CKD stage III.  PMH includes h/o anxiety attacks, CKD, h/o hiatal hernia, htn, MI, neuropathy, stroke, CABG.  Clinical Impression  Prior to hospital admission, pt was modified independent (ambulating with SPC).  Pt lives alone in 1 level home with ramp to enter.  Currently pt is modified independent supine to sit; CGA with transfers; and CGA with ambulation in room with SPC (decreased cadence and unsteadiness noted although pt did not have any loss of balance).  Pt's symptoms described more as "lightheadedness" than "dizziness" during session.  Pt reporting symptoms supine to sit and with standing (no nystagmus noted; pt received meclizine prior to session).  Pt's BP 149/61 sitting edge of bed and 155/94 post ambulation sitting in chair.  Nursing reporting during session that pt was going to go for MRI of brain; deferred vestibular testing as appropriate until results of imaging are known.  Pt would benefit from skilled PT to address noted impairments and functional limitations (see below for any additional details).  Upon hospital discharge, recommend pt discharge to home with 24/7 assist with functional mobility for safety; pt currently using [facility] but may benefit from RW for additional stability/safety with mobility.    Follow Up Recommendations Home health PT;Supervision/Assistance - 24 hour    Equipment Recommendations  Rolling walker with 5" wheels    Recommendations for Other Services       Precautions / Restrictions Precautions Precautions: Fall Restrictions Weight Bearing Restrictions: No      Mobility  Bed Mobility Overal bed  mobility: Modified Independent             General bed mobility comments: Supine to sit with bed flat mild increased effort to perform on own  Transfers Overall transfer level: Needs assistance Equipment used: Straight cane Transfers: Sit to/from Stand;Stand Pivot Transfers Sit to Stand: Min guard Stand pivot transfers: Min guard       General transfer comment: pt initially appearing unsteady with wide BOS standing but no loss of balance noted; fairly strong stand noted  Ambulation/Gait Ambulation/Gait assistance: Min guard Ambulation Distance (Feet): 40 Feet Assistive device: Straight cane   Gait velocity: decreased   General Gait Details: wider BOS; decreased B step length/foot clearance/heelstrike; mildly unsteady but no loss of balance noted  Stairs            Wheelchair Mobility    Modified Rankin (Stroke Patients Only)       Balance Overall balance assessment: Needs assistance Sitting-balance support: No upper extremity supported;Feet supported Sitting balance-Leahy Scale: Normal Sitting balance - Comments: steady sitting reaching outside BOS     Standing balance-Leahy Scale: Fair Standing balance comment: steady static standing but requires at least single UE support to reach within BOS                             Pertinent Vitals/Pain Pain Assessment: No/denies pain  Vitals stable and WFL throughout treatment session.    Home Living Family/patient expects to be discharged to:: Private residence Living Arrangements: Alone   Type of Home: House Home Access: Ramped entrance  Home Layout: One level Home Equipment: Bedside commode;Cane - single point;Toilet riser;Grab bars - tub/shower;Tub bench      Prior Function Level of Independence: Independent with assistive device(s)         Comments: Ambulates with SPC; 1 fall into chair yesterday d/t dizziness     Hand Dominance        Extremity/Trunk Assessment   Upper  Extremity Assessment Upper Extremity Assessment: Generalized weakness    Lower Extremity Assessment Lower Extremity Assessment: Generalized weakness    Cervical / Trunk Assessment Cervical / Trunk Assessment: Normal  Communication   Communication: No difficulties  Cognition Arousal/Alertness: Awake/alert Behavior During Therapy: WFL for tasks assessed/performed Overall Cognitive Status: Within Functional Limits for tasks assessed                                        General Comments General comments (skin integrity, edema, etc.): Pt resting in bed upon PT entry.  Nursing cleared pt for participation in physical therapy.  Pt agreeable to PT session.    Exercises     Assessment/Plan    PT Assessment Patient needs continued PT services  PT Problem List Decreased strength;Decreased balance;Decreased mobility;Decreased knowledge of use of DME;Decreased knowledge of precautions       PT Treatment Interventions DME instruction;Gait training;Functional mobility training;Therapeutic activities;Therapeutic exercise;Balance training;Patient/family education    PT Goals (Current goals can be found in the Care Plan section)  Acute Rehab PT Goals Patient Stated Goal: to improve mobility PT Goal Formulation: With patient Time For Goal Achievement: 11/23/17 Potential to Achieve Goals: Good    Frequency Min 2X/week   Barriers to discharge   Question level of assist available    Co-evaluation               AM-PAC PT "6 Clicks" Daily Activity  Outcome Measure Difficulty turning over in bed (including adjusting bedclothes, sheets and blankets)?: A Little Difficulty moving from lying on back to sitting on the side of the bed? : A Little Difficulty sitting down on and standing up from a chair with arms (e.g., wheelchair, bedside commode, etc,.)?: Unable Help needed moving to and from a bed to chair (including a wheelchair)?: A Little Help needed walking in  hospital room?: A Little Help needed climbing 3-5 steps with a railing? : A Little 6 Click Score: 16    End of Session Equipment Utilized During Treatment: Gait belt Activity Tolerance: Other (comment)(Limited d/t c/o "lightheadedness") Patient left: in chair;with call bell/phone within reach;with chair alarm set Nurse Communication: Mobility status;Precautions PT Visit Diagnosis: Unsteadiness on feet (R26.81);Other abnormalities of gait and mobility (R26.89);Muscle weakness (generalized) (M62.81);History of falling (Z91.81)    Time: 1455-1530 PT Time Calculation (min) (ACUTE ONLY): 35 min   Charges:   PT Evaluation $PT Eval Low Complexity: 1 Low PT Treatments $Therapeutic Activity: 8-22 mins   PT G CodesHendricks Limes, PT 11/09/17, 4:43 PM [phone]

## 2017-11-09 NOTE — Care Management Obs Status (Signed)
MEDICARE OBSERVATION STATUS NOTIFICATION   Patient Details  Name: Terry Stewart MRN: [id number] Date of Birth: 04-20-1924   Medicare Observation Status Notification Given:  Yes    Marily Memos, RN 11/09/2017, 10:04 AM

## 2017-11-10 ENCOUNTER — Observation Stay: Payer: Medicare Other

## 2017-11-10 DIAGNOSIS — R42 Dizziness and giddiness: Secondary | ICD-10-CM | POA: Diagnosis not present

## 2017-11-10 NOTE — Progress Notes (Signed)
Physical Therapy Treatment Patient Details Name: Terry Stewart MRN: [id number] DOB: 1923/08/29 Today's Date: 11/10/2017    History of Present Illness Pt is a 84 y.o. male presenting to hospital 11/08/17 with acute sudden dizziness x1 day (worse turning head to L and also when standing up); also currently being treated for UTI.  Pt admitted with vertigo, UTI, and CKD stage III.  PMH includes h/o anxiety attacks, CKD, h/o hiatal hernia, htn, MI, neuropathy, stroke, CABG.    PT Comments    Pt with occasional brief reports of dizziness (inconsistent with position changes); no nystagmus noted.  Dix-hallpike test negative B.  Performed orthostatic vital signs:  Supine BP 144/67 with HR 56 bpm; sitting BP 146/84 with HR 64 bpm; standing at 0 minutes BP 131/79 with HR 68 bpm; and standing at 3 minutes BP 148/74 with HR 70 bpm; O2 sats 96-97% during orthostatic vitals.  Pt steady with transfers and ambulation using RW (although decreased cadence noted with ambulation).  Will continue to progress pt with strengthening, balance, and progressive functional mobility per pt tolerance during hospital stay.   Follow Up Recommendations  Home health PT;Supervision/Assistance - 24 hour     Equipment Recommendations  Rolling walker with 5" wheels    Recommendations for Other Services       Precautions / Restrictions Precautions Precautions: Fall Restrictions Weight Bearing Restrictions: No    Mobility  Bed Mobility Overal bed mobility: Modified Independent             General bed mobility comments: Supine to/from sit with bed flat mild increased effort to perform on own  Transfers Overall transfer level: Needs assistance Equipment used: Rolling walker (2 wheeled) Transfers: Sit to/from [employer] Sit to Stand: Min guard Stand pivot transfers: Min guard       General transfer comment: initial vc's for hand placement with transfers using RW and then pt able to perform on own;  steady safe transfers noted with use of RW  Ambulation/Gait Ambulation/Gait assistance: Min guard Ambulation Distance (Feet): 60 Feet Assistive device: Rolling walker (2 wheeled)   Gait velocity: decreased   General Gait Details: decreased B step length/foot clearance/heelstrike; steady with RW   Stairs             Wheelchair Mobility    Modified Rankin (Stroke Patients Only)       Balance Overall balance assessment: Needs assistance Sitting-balance support: No upper extremity supported;Feet supported Sitting balance-Leahy Scale: Normal Sitting balance - Comments: steady sitting reaching outside BOS     Standing balance-Leahy Scale: Fair Standing balance comment: steady static standing but requires at least single UE support to reach within BOS                            Cognition Arousal/Alertness: Awake/alert Behavior During Therapy: WFL for tasks assessed/performed Overall Cognitive Status: Within Functional Limits for tasks assessed                                        Exercises      General Comments General comments (skin integrity, edema, etc.): Pt resting in bed upon PT entry.  Nursing cleared pt for participation in physical therapy.  Pt agreeable to PT session.  D/t concerns regarding pt's AROM neck extension, Dix Hallpike performed with bed flat and in trendelenburg position (head lower than  feet) with pt's head staying on bed.      Pertinent Vitals/Pain Pain Assessment: No/denies pain    Home Living                      Prior Function            PT Goals (current goals can now be found in the care plan section) Acute Rehab PT Goals Patient Stated Goal: to improve mobility PT Goal Formulation: With patient Time For Goal Achievement: 11/23/17 Potential to Achieve Goals: Good Progress towards PT goals: Progressing toward goals    Frequency    Min 2X/week      PT Plan Current plan remains  appropriate    Co-evaluation              AM-PAC PT "6 Clicks" Daily Activity  Outcome Measure  Difficulty turning over in bed (including adjusting bedclothes, sheets and blankets)?: A Little Difficulty moving from lying on back to sitting on the side of the bed? : A Little Difficulty sitting down on and standing up from a chair with arms (e.g., wheelchair, bedside commode, etc,.)?: A Little Help needed moving to and from a bed to chair (including a wheelchair)?: A Little Help needed walking in hospital room?: A Little Help needed climbing 3-5 steps with a railing? : A Little 6 Click Score: 18    End of Session Equipment Utilized During Treatment: Gait belt Activity Tolerance: Other (comment)(occasional brief reports of dizziness) Patient left: in chair;with call bell/phone within reach;with chair alarm set Nurse Communication: Mobility status;Precautions;Other (comment)(Pt's BP vitals) PT Visit Diagnosis: Unsteadiness on feet (R26.81);Other abnormalities of gait and mobility (R26.89);Muscle weakness (generalized) (M62.81);History of falling (Z91.81)     Time: 1610-9604 PT Time Calculation (min) (ACUTE ONLY): 38 min  Charges:  $Gait Training: 8-22 mins $Therapeutic Exercise: 8-22 mins $Therapeutic Activity: 8-22 mins                    G CodesHendricks Limes, PT 11/10/17, 1:51 PM [phone]

## 2017-11-10 NOTE — Care Management Note (Signed)
Case Management Note  Patient Details  Name: Terry Stewart MRN: [id number] Date of Birth: Nov 23, 1923  Subjective/Objective:  Met with patient at bedside to discuss discharge planning and PT recommendations of home health PT. Patient agreeable. Offered a list of home care providers. Referral to [facility] for HHPT. Patient has multiple walker and canes.                   Action/Plan: [facility] for HHPT.   Expected Discharge Date:                  Expected Discharge Plan:  [place]  In-House Referral:     Discharge planning Services  CM Consult  Post Acute Care Choice:  Home Health Choice offered to:  Patient  DME Arranged:    DME Agency:     HH Arranged:  PT [place]:  [facility] (formerly [employer])  Status of Service:  In process, will continue to follow  If discussed at Long Length of Stay Meetings, dates discussed:    Additional Comments:  Jolly Mango, RN 11/10/2017, 9:26 AM

## 2017-11-24 NOTE — Discharge Summary (Signed)
[facility] [place]   PATIENT NAME: Terry Stewart    MR#:  [id number]  DATE OF BIRTH:  01/24/1924  DATE OF ADMISSION:  11/08/2017 ADMITTING PHYSICIAN: Barbaraann Rondo, MD  DATE OF DISCHARGE: 11/10/2017  6:57 PM  PRIMARY CARE PHYSICIAN: Jaclyn Shaggy, MD   ADMISSION DIAGNOSIS:  Vertigo [R42]  DISCHARGE DIAGNOSIS:  Active Problems:   Vertigo   SECONDARY DIAGNOSIS:   Past Medical History:  Diagnosis Date  . Anxiety    attacks  . Chronic kidney disease    stones  . Dysrhythmia   . GERD (gastroesophageal reflux disease)   . Hepatitis   . History of hiatal hernia   . History of stomach ulcers   . Hypertension   . Myocardial infarction (HCC)    2002  . Neuropathy   . Prostate disorder   . Rosacea   . Stroke [facility])      ADMITTING HISTORY  HISTORY OF PRESENT ILLNESS:  Terry Stewart  is a 84 y.o. male with a known history of HTN, HLD, CAD/MI (s/p CABG x3, 2002), TIA/CVA, COPD/ILD, GERD, BPH, peripheral neuropathy, ACE-I-induced cough p/w 1d Hx vertigo/imbalance. Pt is AAOx3, appears younger than stated age, and is a very good historian. He is accompanied by his daughter, who is at bedside. She tells me the pt still mows his own yard. He ambulates w/ a cane (and at times uses his wife's old walker), and his daughter states he has not had any falls. The pt is well-appearing, and is in no acute distress. He states that he visited the ED ~62mo ago (10/07/2017), and was Dx w/ UTI, Rx Keflex and D/Ced. At that time, the pt had gone to the ED w/ c/o generalized weakness and unsteadiness. He endorses similar symptoms on present visit, but endorses additional complaints.  Pt states he saw his PCP last Friday (11/03/2017) for routine checkup and labwork. He states a U/A performed at the time was (+) UTI, and he was Rx Cefuroxime  PO BID. He has since felt reasonably well, up until the date of admission (Wednesday 11/08/2017). Pt states he lives by himself,  and based on his description, manages all ADLs independently, as well as most of his IADLs (w/ minimal assistance). He was cooking supper, and got up to check on his food. He states he tried to get up from the sitting position slowly, but felt as though he was going to fall to his left side and into the wall. He sat back down immediately. He states he remained seated for 3-4 minutes, then tried to stand up again, but again felt as though he was going to fall to his L side, and sat back down. He contacted his daughter, who brought him to the ED. In the ED, the pt states he developed frank vertigo while trying to get up out of bed to go to the bathroom. He describes the sensation as the room spinning around him, worse w/ positional change, head movement and ambulation. His symptoms have resolved at the time of my Hx/examination. He received Meclizine in the ED.  Pt endorses baseline peripheral neuropathy, w/ abnormal (decreased) sensation below the knees B/L, w/ intermittent burning sensation in the B/L thighs, improved w/ Gabapentin, unchanged from baseline on present admission. He denies memory loss or incontinence. He denies back pain or saddle anesthesia. He denies wide-based gait or frequent falls. He denies headache, blurred vision or changes in vision, hearing loss or changes in hearing, tinnitus,  unilateral weakness, facial droop, slurred speech, aphasia, ataxia, LH or LOC. He does endorse poor PO intake since his wife was placed in a nursing home 2/2 dementia (in 09/2017), but he states he is feeling reasonably well, and is otherwise w/o complaint. (-) F/C/N/V/D/AP, CP, SOB, palpitations, diaphoresis, rigors, night sweats, weight loss, urinary symptoms.  HOSPITAL COURSE:   *Dizziness. Patient was admitted to medical floor. An MRI of the brain was checked which did not show any acute stroke. His symptoms were typical for vertigo. All the stats are checked and normal. Started on meclizine which showed  significant improvement. Patient was seen by physical therapy. Home health physical therapy set up discharge. Prescription given for meclizine.  Hypertension remains stable during the hospital stay.  Discharged home in stable condition with home health services set up.  CONSULTS OBTAINED:  Treatment Team:  Barbaraann Rondo, MD  DRUG ALLERGIES:   Allergies  Allergen Reactions  . Peanut-Containing Drug Products Anaphylaxis  . Codeine Other (See Comments)    hallucinations    DISCHARGE MEDICATIONS:   Allergies as of 11/10/2017      Reactions   Peanut-containing Drug Products Anaphylaxis   Codeine Other (See Comments)   hallucinations      Medication List    TAKE these medications   aspirin EC 81 MG tablet Take 81 mg by mouth at bedtime.   cefUROXime 500 MG tablet Commonly known as:  CEFTIN Take 500 mg by mouth 2 (two) times daily.   cyanocobalamin 1000 MCG/ML injection Commonly known as:  (VITAMIN B-12) Inject 1,000 mcg into the muscle every 30 (thirty) days.   dutasteride 0.5 MG capsule Commonly known as:  AVODART Take 0.5 mg by mouth every other day.   gabapentin 300 MG capsule Commonly known as:  NEURONTIN Take 300 mg by mouth daily at 12 noon.   gabapentin 600 MG tablet Commonly known as:  NEURONTIN Take 600 mg by mouth at bedtime.   losartan 25 MG tablet Commonly known as:  COZAAR Take 25 mg by mouth daily.   meclizine 25 MG tablet Commonly known as:  ANTIVERT Take 1 tablet (25 mg total) by mouth 3 (three) times daily as needed for dizziness.   nitroGLYCERIN 0.4 MG SL tablet Commonly known as:  NITROSTAT Place 0.4 mg under the tongue every 5 (five) minutes x 3 doses as needed for chest pain.   omeprazole 40 MG capsule Commonly known as:  PRILOSEC Take 40 mg by mouth daily.   simvastatin 40 MG tablet Commonly known as:  ZOCOR Take 40 mg by mouth at bedtime.       Today   VITAL SIGNS:  Blood pressure (!) 158/78, pulse (!) 55,  temperature 99 F (37.2 C), temperature source Oral, resp. rate 18, height  (1.803 m), weight 71.7 kg (158 lb), SpO2 97 %.  I/O:  No intake or output data in the 24 hours ending 11/24/17 1502  PHYSICAL EXAMINATION:  Physical Exam  GENERAL:  84 y.o.-year-old patient lying in the bed with no acute distress.  LUNGS: Normal breath sounds bilaterally, no wheezing, rales,rhonchi or crepitation. No use of accessory muscles of respiration.  CARDIOVASCULAR: S1, S2 normal. No murmurs, rubs, or gallops.  ABDOMEN: Soft, non-tender, non-distended. Bowel sounds present. No organomegaly or mass.  NEUROLOGIC: Moves all 4 extremities. PSYCHIATRIC: The patient is alert and oriented x 3.  SKIN: No obvious rash, lesion, or ulcer.   DATA REVIEW:   CBC No results for input(s): WBC, HGB, HCT, PLT in  the last 168 hours.  Chemistries  No results for input(s): NA, K, CL, CO2, GLUCOSE, BUN, CREATININE, CALCIUM, MG, AST, ALT, ALKPHOS, BILITOT in the last 168 hours.  Invalid input(s): GFRCGP  Cardiac Enzymes No results for input(s): TROPONINI in the last 168 hours.  Microbiology Results  No results found for this or any previous visit.  RADIOLOGY:  No results found.  Follow up with PCP in 1 week.  Management plans discussed with the patient, family and they are in agreement.  CODE STATUS:  Code Status History    Date Active Date Inactive Code Status Order ID Comments User Context   11/09/2017 0035 11/10/2017 2202 Full Code [id number]  Barbaraann Rondo, MD ED    Advance Directive Documentation     Most Recent Value  Type of Advance Directive  Healthcare Power of Attorney, Living will  Pre-existing out of facility DNR order (yellow form or pink MOST form)  -  "MOST" Form in Place?  -      TOTAL TIME TAKING CARE OF THIS PATIENT ON DAY OF DISCHARGE: more than 30 minutes.   Molinda Bailiff Jefrey Raburn M.D on 11/24/2017 at 3:02 PM  Between 7am to 6pm - Pager - [phone]  After 6pm go to  [email or web address] - password [email or web address] [facility]  [facility]  Office  [phone]  CC: Primary care physician; Jaclyn Shaggy, MD  Note: This dictation was prepared with Dragon dictation along with smaller phrase technology. Any transcriptional errors that result from this process are unintentional.

## 2023-03-05 DEATH — deceased
